# Patient Record
Sex: Male | Born: 1935 | Race: White | Hispanic: No | Marital: Married | Smoking: Former smoker
Health system: Southern US, Community
[De-identification: ages and names within clinical notes are randomized; demographics above are authoritative.]

## PROBLEM LIST (undated history)

## (undated) DIAGNOSIS — M79606 Pain in leg, unspecified: Secondary | ICD-10-CM

## (undated) DIAGNOSIS — G8929 Other chronic pain: Secondary | ICD-10-CM

## (undated) DIAGNOSIS — M549 Dorsalgia, unspecified: Secondary | ICD-10-CM

## (undated) HISTORY — DX: Dorsalgia, unspecified: M54.9

## (undated) HISTORY — DX: Pain in leg, unspecified: M79.606

## (undated) HISTORY — DX: Other chronic pain: G89.29

---

## 1960-12-12 HISTORY — PX: OTHER SURGICAL HISTORY: SHX169

## 2015-11-02 LAB — CBC AND DIFFERENTIAL
HCT: 40 — AB (ref 41–53)
HEMOGLOBIN: 13.3 — AB (ref 13.5–17.5)
Platelets: 168 (ref 150–399)
WBC: 7.7

## 2015-11-02 LAB — LIPID PANEL
CHOLESTEROL: 213 — AB (ref 0–200)
HDL: 49 (ref 35–70)
LDL Cholesterol: 136
Triglycerides: 141 (ref 40–160)

## 2015-11-02 LAB — HEPATIC FUNCTION PANEL
AST: 18 (ref 14–40)
Alkaline Phosphatase: 37 (ref 25–125)
BILIRUBIN, TOTAL: 0.6

## 2015-11-02 LAB — BASIC METABOLIC PANEL
BUN: 27 — AB (ref 4–21)
Creatinine: 1.5 — AB (ref 0.6–1.3)
GLUCOSE: 85
POTASSIUM: 4.8 (ref 3.4–5.3)
SODIUM: 140 (ref 137–147)

## 2017-11-09 LAB — HEPATIC FUNCTION PANEL
Alkaline Phosphatase: 47 (ref 25–125)
BILIRUBIN, TOTAL: 0.5

## 2017-11-09 LAB — CBC AND DIFFERENTIAL
HEMATOCRIT: 38 — AB (ref 41–53)
HEMOGLOBIN: 12.9 — AB (ref 13.5–17.5)
NEUTROS ABS: 5
WBC: 7.1

## 2017-11-09 LAB — BASIC METABOLIC PANEL
BUN: 26 — AB (ref 4–21)
Glucose: 100
POTASSIUM: 5.5 — AB (ref 3.4–5.3)
Sodium: 145 (ref 137–147)

## 2018-01-22 ENCOUNTER — Encounter: Payer: Self-pay | Admitting: Family Medicine

## 2018-10-01 ENCOUNTER — Ambulatory Visit: Payer: Medicare (Managed Care) | Admitting: Family Medicine

## 2018-10-01 ENCOUNTER — Encounter: Payer: Self-pay | Admitting: Family Medicine

## 2018-10-01 VITALS — BP 120/70 | HR 68 | Temp 98.0°F | Ht 68.0 in | Wt 161.4 lb

## 2018-10-01 DIAGNOSIS — M545 Low back pain: Secondary | ICD-10-CM | POA: Diagnosis not present

## 2018-10-01 DIAGNOSIS — I10 Essential (primary) hypertension: Secondary | ICD-10-CM | POA: Diagnosis not present

## 2018-10-01 DIAGNOSIS — M79604 Pain in right leg: Secondary | ICD-10-CM

## 2018-10-01 DIAGNOSIS — M549 Dorsalgia, unspecified: Secondary | ICD-10-CM

## 2018-10-01 DIAGNOSIS — Z23 Encounter for immunization: Secondary | ICD-10-CM

## 2018-10-01 DIAGNOSIS — M79606 Pain in leg, unspecified: Secondary | ICD-10-CM

## 2018-10-01 DIAGNOSIS — G8929 Other chronic pain: Secondary | ICD-10-CM | POA: Diagnosis not present

## 2018-10-01 MED ORDER — HYDROCODONE-ACETAMINOPHEN 5-325 MG PO TABS: 1.0000 | ORAL_TABLET | Freq: Four times a day (QID) | ORAL | 0 refills | Status: DC | PRN
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
## 2018-10-08 ENCOUNTER — Other Ambulatory Visit: Payer: Self-pay | Admitting: Family Medicine

## 2018-10-08 NOTE — Telephone Encounter (Signed)
Copied from CRM [id number]. Topic: Quick Communication - Rx Refill/Question >> Oct 08, 2018  8:57 AM Arlyss Gandy, NT wrote: Medication: mirtazapine (REMERON) 15 MG tablet, lisinopril (PRINIVIL,ZESTRIL) 20 MG tablet, atorvastatin (LIPITOR) 20 MG tablet, and donepezil (ARICEPT) 10 MG tablet   Has the patient contacted their pharmacy? Yes.   (Agent: If no, request that the patient contact the pharmacy for the refill.) (Agent: If yes, when and what did the pharmacy advise?)  Preferred Pharmacy (with phone number or street name): [facility] [address] [place] - [address] [phone] (Phone) [phone] (Fax)    Agent: Please be advised that RX refills may take up to 3 business days. We ask that you follow-up with your pharmacy.

## 2018-10-08 NOTE — Telephone Encounter (Signed)
Requested medication (s) are due for refill today: yes  Requested medication (s) are on the active medication list: yes    Last refill:   Future visit scheduled no  Notes to clinic:All meds by historical provider  Requested Prescriptions  Pending Prescriptions Disp Refills   atorvastatin (LIPITOR) 20 MG tablet      Sig: Take 1 tablet (20 mg total) by mouth daily.     Cardiovascular:  Antilipid - Statins Failed - 10/08/2018  9:07 AM      Failed - Total Cholesterol in normal range and within 360 days    No results found for: CHOL, POCCHOL       Failed - LDL in normal range and within 360 days    No results found for: LDLCALC, LDLC, HIRISKLDL       Failed - HDL in normal range and within 360 days    No results found for: HDL       Failed - Triglycerides in normal range and within 360 days    No results found for: TRIG       Passed - Patient is not pregnant      Passed - Valid encounter within last 12 months    Recent Outpatient Visits          1 week ago Chronic midline low back pain without sciatica   [facility], [place] C, MD            donepezil (ARICEPT) 10 MG tablet      Sig: Take 1 tablet (10 mg total) by mouth at bedtime.     Neurology:  Alzheimer's Agents Passed - 10/08/2018  9:07 AM      Passed - Valid encounter within last 6 months    Recent Outpatient Visits          1 week ago Chronic midline low back pain without sciatica   [facility], [place]ore, MD            lisinopril (PRINIVIL,ZESTRIL) 20 MG tablet      Sig: Take 1 tablet (20 mg total) by mouth daily.     Cardiovascular:  ACE Inhibitors Failed - 10/08/2018  9:07 AM      Failed - Cr in normal range and within 180 days    No results found for: CREATININE       Failed - K in normal range and within 180 days    No results found for: K, POTASSIUM       Passed - Patient is not pregnant      Passed - Last BP in normal range    BP  Readings from Last 1 Encounters:  10/01/18 120/70         Passed - Valid encounter within last 6 months    Recent Outpatient Visits          1 week ago Chronic midline low back pain without sciatica   [facility], [place] C, MD            mirtazapine (REMERON) 15 MG tablet      Sig: Take 1 tablet (15 mg total) by mouth at bedtime.     Psychiatry: Antidepressants - mirtazapine Failed - 10/08/2018  9:07 AM      Failed - AST in normal range and within 360 days    No results found for: POCAST, AST       Failed - ALT in normal range  and within 360 days    No results found for: ALT       Failed - Triglycerides in normal range and within 360 days    No results found for: TRIG       Failed - Total Cholesterol in normal range and within 360 days    No results found for: CHOL, POCCHOL       Failed - WBC in normal range and within 360 days    No results found for: WBC, WBCKUC       Passed - Valid encounter within last 6 months    Recent Outpatient Visits          1 week ago Chronic midline low back pain without sciatica   [facility], [place] Lore, [place]

## 2018-10-09 NOTE — Telephone Encounter (Signed)
All meds are Historical  Ok to fill?

## 2018-10-10 MED ORDER — LISINOPRIL 20 MG PO TABS: 20.0000 mg | ORAL_TABLET | Freq: Every day | ORAL | 1 refills | Status: DC

## 2018-10-10 MED ORDER — DONEPEZIL HCL 10 MG PO TABS: 10.0000 mg | ORAL_TABLET | Freq: Every day | ORAL | 1 refills | Status: DC

## 2018-10-10 MED ORDER — ATORVASTATIN CALCIUM 20 MG PO TABS: 20.0000 mg | ORAL_TABLET | Freq: Every day | ORAL | 1 refills | Status: DC

## 2018-10-10 MED ORDER — MIRTAZAPINE 15 MG PO TABS: 15.0000 mg | ORAL_TABLET | Freq: Every day | ORAL | 1 refills | Status: DC

## 2018-10-13 ENCOUNTER — Encounter: Payer: Self-pay | Admitting: Family Medicine

## 2018-10-13 DIAGNOSIS — H43813 Vitreous degeneration, bilateral: Secondary | ICD-10-CM | POA: Insufficient documentation

## 2018-10-13 DIAGNOSIS — H353190 Nonexudative age-related macular degeneration, unspecified eye, stage unspecified: Secondary | ICD-10-CM | POA: Insufficient documentation

## 2018-10-13 DIAGNOSIS — Z961 Presence of intraocular lens: Secondary | ICD-10-CM | POA: Insufficient documentation

## 2018-10-19 ENCOUNTER — Encounter: Payer: Self-pay | Admitting: Family Medicine

## 2018-10-20 ENCOUNTER — Telehealth: Payer: Self-pay | Admitting: Family Medicine

## 2018-10-20 DIAGNOSIS — I10 Essential (primary) hypertension: Secondary | ICD-10-CM

## 2018-10-20 DIAGNOSIS — N189 Chronic kidney disease, unspecified: Secondary | ICD-10-CM

## 2018-10-20 DIAGNOSIS — D649 Anemia, unspecified: Secondary | ICD-10-CM

## 2018-10-20 DIAGNOSIS — E785 Hyperlipidemia, unspecified: Secondary | ICD-10-CM

## 2018-10-20 NOTE — Telephone Encounter (Signed)
I was able to review his records. Last bloodwork I have is from 10/2017. So I have ordered repeat. If he thinks he has had sooner; then he will have to tell us where but that is the most updated they sent along with a more recent note.   He had kidney function that was worsening and anemia which should be followed up so please complete bloodwork at convenience. Thanks!

## 2018-10-22 ENCOUNTER — Encounter: Payer: Self-pay | Admitting: Family Medicine

## 2018-10-22 NOTE — Telephone Encounter (Signed)
Discussed notes with patients wife. Lab appointment made for Friday.

## 2018-10-26 ENCOUNTER — Other Ambulatory Visit (INDEPENDENT_AMBULATORY_CARE_PROVIDER_SITE_OTHER): Payer: Medicare (Managed Care)

## 2018-10-26 DIAGNOSIS — E785 Hyperlipidemia, unspecified: Secondary | ICD-10-CM

## 2018-10-26 DIAGNOSIS — I10 Essential (primary) hypertension: Secondary | ICD-10-CM

## 2018-10-26 DIAGNOSIS — N189 Chronic kidney disease, unspecified: Secondary | ICD-10-CM

## 2018-10-26 DIAGNOSIS — D649 Anemia, unspecified: Secondary | ICD-10-CM | POA: Diagnosis not present

## 2018-10-26 LAB — COMPREHENSIVE METABOLIC PANEL
ALBUMIN: 4.5 g/dL (ref 3.5–5.2)
ALK PHOS: 37 U/L — AB (ref 39–117)
ALT: 13 U/L (ref 0–53)
AST: 11 U/L (ref 0–37)
BILIRUBIN TOTAL: 0.5 mg/dL (ref 0.2–1.2)
BUN: 20 mg/dL (ref 6–23)
CALCIUM: 10 mg/dL (ref 8.4–10.5)
CO2: 32 mEq/L (ref 19–32)
Chloride: 107 mEq/L (ref 96–112)
Creatinine, Ser: 1.81 mg/dL — ABNORMAL HIGH (ref 0.40–1.50)
GFR: 38.28 mL/min — AB (ref >60.00)
GLUCOSE: 99 mg/dL (ref 70–99)
POTASSIUM: 5.1 meq/L (ref 3.5–5.1)
Sodium: 145 mEq/L (ref 135–145)
Total Protein: 6.8 g/dL (ref 6.0–8.3)

## 2018-10-26 LAB — CBC WITH DIFFERENTIAL/PLATELET
Basophils Absolute: 0 10*3/uL (ref 0.0–0.1)
Basophils Relative: 0.5 % (ref 0.0–3.0)
EOS PCT: 5 % (ref 0.0–5.0)
Eosinophils Absolute: 0.4 10*3/uL (ref 0.0–0.7)
HEMATOCRIT: 38.6 % — AB (ref 39.0–52.0)
Hemoglobin: 13 g/dL (ref 13.0–17.0)
LYMPHS ABS: 1.4 10*3/uL (ref 0.7–4.0)
LYMPHS PCT: 18.6 % (ref 12.0–46.0)
MCHC: 33.7 g/dL (ref 30.0–36.0)
MCV: 95.1 fl (ref 78.0–100.0)
MONOS PCT: 6.7 % (ref 3.0–12.0)
Monocytes Absolute: 0.5 10*3/uL (ref 0.1–1.0)
NEUTROS ABS: 5.4 10*3/uL (ref 1.4–7.7)
NEUTROS PCT: 69.2 % (ref 43.0–77.0)
Platelets: 182 10*3/uL (ref 150.0–400.0)
RBC: 4.06 Mil/uL — ABNORMAL LOW (ref 4.22–5.81)
RDW: 13.5 % (ref 11.5–15.5)
WBC: 7.7 10*3/uL (ref 4.0–10.5)

## 2018-10-26 LAB — LIPID PANEL
Cholesterol: 142 mg/dL (ref 0–200)
HDL: 56 mg/dL (ref >39.00)
LDL Cholesterol: 73 mg/dL (ref 0–99)
NonHDL: 86.28
TRIGLYCERIDES: 66 mg/dL (ref 0.0–149.0)
Total CHOL/HDL Ratio: 3
VLDL: 13.2 mg/dL (ref 0.0–40.0)

## 2018-10-26 LAB — VITAMIN B12: VITAMIN B 12: 494 pg/mL (ref 211–911)

## 2018-10-26 LAB — FOLATE

## 2018-10-26 LAB — TSH: TSH: 0.59 u[IU]/mL (ref 0.35–4.50)

## 2018-12-14 ENCOUNTER — Ambulatory Visit (INDEPENDENT_AMBULATORY_CARE_PROVIDER_SITE_OTHER): Payer: Medicare Other | Admitting: Family Medicine

## 2018-12-14 VITALS — BP 110/78 | HR 107 | Temp 99.1°F | Wt 159.0 lb

## 2018-12-14 DIAGNOSIS — B029 Zoster without complications: Secondary | ICD-10-CM

## 2018-12-14 MED ORDER — FAMCICLOVIR 500 MG PO TABS: 500.0000 mg | ORAL_TABLET | Freq: Every day | ORAL | 0 refills | Status: AC

## 2018-12-14 NOTE — Patient Instructions (Signed)
Shingles    Shingles, which is also known as herpes zoster, is an infection that causes a painful skin rash and fluid-filled blisters. It is caused by a virus.  Shingles only develops in people who:   Have had chickenpox.   Have been given a medicine to protect against chickenpox (have been vaccinated). Shingles is rare in this group.  What are the causes?  Shingles is caused by varicella-zoster virus (VZV). This is the same virus that causes chickenpox. After a person is exposed to VZV, the virus stays in the body in an inactive (dormant) state. Shingles develops if the virus is reactivated. This can happen many years after the first (initial) exposure to VZV. It is not known what causes this virus to be reactivated.  What increases the risk?  People who have had chickenpox or received the chickenpox vaccine are at risk for shingles. Shingles infection is more common in people who:   Are older than age 60.   Have a weakened disease-fighting system (immune system), such as people with:  ? HIV.  ? AIDS.  ? Cancer.   Are taking medicines that weaken the immune system, such as transplant medicines.   Are experiencing a lot of stress.  What are the signs or symptoms?  Early symptoms of this condition include itching, tingling, and pain in an area on your skin. Pain may be described as burning, stabbing, or throbbing.  A few days or weeks after early symptoms start, a painful red rash appears. The rash is usually on one side of the body and has a band-like or belt-like pattern. The rash eventually turns into fluid-filled blisters that break open, change into scabs, and dry up in about 2-3 weeks.  At any time during the infection, you may also develop:   A fever.   Chills.   A headache.   An upset stomach.  How is this diagnosed?  This condition is diagnosed with a skin exam. Skin or fluid samples may be taken from the blisters before a diagnosis is made. These samples are examined under a microscope or sent to  a lab for testing.  How is this treated?  The rash may last for several weeks. There is not a specific cure for this condition. Your health care provider will probably prescribe medicines to help you manage pain, recover more quickly, and avoid long-term problems. Medicines may include:   Antiviral drugs.   Anti-inflammatory drugs.   Pain medicines.   Anti-itching medicines (antihistamines).  If the area involved is on your face, you may be referred to a specialist, such as an eye doctor (ophthalmologist) or an ear, nose, and throat (ENT) doctor (otolaryngologist) to help you avoid eye problems, chronic pain, or disability.  Follow these instructions at home:  Medicines   Take over-the-counter and prescription medicines only as told by your health care provider.   Apply an anti-itch cream or numbing cream to the affected area as told by your health care provider.  Relieving itching and discomfort     Apply cold, wet cloths (cold compresses) to the area of the rash or blisters as told by your health care provider.   Cool baths can be soothing. Try adding baking soda or dry oatmeal to the water to reduce itching. Do not bathe in hot water.  Blister and rash care   Keep your rash covered with a loose bandage (dressing). Wear loose-fitting clothing to help ease the pain of material rubbing against the rash.     Keep your rash and blisters clean by washing the area with mild soap and cool water as told by your health care provider.   Check your rash every day for signs of infection. Check for:  ? More redness, swelling, or pain.  ? Fluid or blood.  ? Warmth.  ? Pus or a bad smell.   Do not scratch your rash or pick at your blisters. To help avoid scratching:  ? Keep your fingernails clean and cut short.  ? Wear gloves or mittens while you sleep, if scratching is a problem.  General instructions   Rest as told by your health care provider.   Keep all follow-up visits as told by your health care provider. This  is important.   Wash your hands often with soap and water. If soap and water are not available, use hand sanitizer. Doing this lowers your chance of getting a bacterial skin infection.   Before your blisters change into scabs, your shingles infection can cause chickenpox in people who have never had it or have never been vaccinated against it. To prevent this from happening, avoid contact with other people, especially:  ? Babies.  ? Pregnant women.  ? Children who have eczema.  ? Elderly people who have transplants.  ? People who have chronic illnesses, such as cancer or AIDS.  Contact a health care provider if:   Your pain is not relieved with prescribed medicines.   Your pain does not get better after the rash heals.   You have signs of infection in the rash area, such as:  ? More redness, swelling, or pain around the rash.  ? Fluid or blood coming from the rash.  ? The rash area feeling warm to the touch.  ? Pus or a bad smell coming from the rash.  Get help right away if:   The rash is on your face or nose.   You have facial pain, pain around your eye area, or loss of feeling on one side of your face.   You have difficulty seeing.   You have ear pain or have ringing in your ear.   You have a loss of taste.   Your condition gets worse.  Summary   Shingles, which is also known as herpes zoster, is an infection that causes a painful skin rash and fluid-filled blisters.   This condition is diagnosed with a skin exam. Skin or fluid samples may be taken from the blisters and examined before the diagnosis is made.   Keep your rash covered with a loose bandage (dressing). Wear loose-fitting clothing to help ease the pain of material rubbing against the rash.   Before your blisters change into scabs, your shingles infection can cause chickenpox in people who have never had it or have never been vaccinated against it.  This information is not intended to replace advice given to you by your health care  provider. Make sure you discuss any questions you have with your health care provider.  Document Released: 11/28/2005 Document Revised: 08/02/2017 Document Reviewed: 08/02/2017  Elsevier Interactive Patient Education  2019 Elsevier Inc.

## 2018-12-14 NOTE — Progress Notes (Signed)
Subjective:    Patient ID: Jonathon Martin, male    DOB: 01/28/1934, 85 y.o.   MRN: [id number]  No chief complaint on file. Pt accompanied by his wife and daughter.  HPI Patient was seen today for acute concern.  Pt with pmh sig for HTN, chronic pain, macular degeneration who is typically seen by Dr. Hassan Rowan.  Rash: -blisters around R sided -x 3 days -burning sensation and discomfort in area of rash -endorses h/o chicken pox as a child -pt had a shingles vaccine -denies recent illness, fever, increased stress. -has been keeping area covered with gauze  Past Medical History:  Diagnosis Date  . Chronic back pain   . Chronic leg pain     Allergies  Allergen Reactions  . Amlodipine Anaphylaxis  . Allegra [Fexofenadine]   . Avapro [Irbesartan]   . Ivp Dye [Iodinated Diagnostic Agents]   . Monopril [Fosinopril]     ROS General: Denies fever, chills, night sweats, changes in weight, changes in appetite HEENT: Denies headaches, ear pain, changes in vision, rhinorrhea, sore throat CV: Denies CP, palpitations, SOB, orthopnea Pulm: Denies SOB, cough, wheezing GI: Denies abdominal pain, nausea, vomiting, diarrhea, constipation GU: Denies dysuria, hematuria, frequency, vaginal discharge Msk: Denies muscle cramps, joint pains Neuro: Denies weakness, numbness, tingling Skin: Denies bruising   +rash Psych: Denies depression, anxiety, hallucinations    Objective:    Blood pressure 110/78, pulse (!) 107, temperature 99.1 F (37.3 C), temperature source Oral, weight 159 lb (72.1 kg), SpO2 97 %.  Gen. Pleasant, well-nourished, in no distress, normal affect  HEENT: Lowgap/AT, face symmetric, conjunctiva clear, no scleral icterus, PERRLA, nares patent without drainage Lungs: no accessory muscle use, CTAB, no wheezes or rales Cardiovascular: Tachycardia, no peripheral edema Neuro:  A&Ox3, CN II-XII intact, ambulates with cane Skin:  Warm.  Serous strike-through noted on removed dressings.   Severe vesicular rash from low back around R flank to lower abdomen and a few erythematous raised lesions in navel.  Ruptured vesicles on R lower back.  No vesicular lesions on face or other areas of body.     Wt Readings from Last 3 Encounters:  10/01/18 161 lb 6.4 oz (73.2 kg)    Lab Results  Component Value Date   WBC 7.7 10/26/2018   HGB 13.0 10/26/2018   HCT 38.6 (L) 10/26/2018   PLT 182.0 10/26/2018   GLUCOSE 99 10/26/2018   CHOL 142 10/26/2018   TRIG 66.0 10/26/2018   HDL 56.00 10/26/2018   LDLCALC 73 10/26/2018   ALT 13 10/26/2018   AST 11 10/26/2018   NA 145 10/26/2018   K 5.1 10/26/2018   CL 107 10/26/2018   CREATININE 1.81 (H) 10/26/2018   BUN 20 10/26/2018   CO2 32 10/26/2018   TSH 0.59 10/26/2018    Assessment/Plan:  Herpes zoster without complication  -renally dosed antiviral medication 2/2 creatinine clearance -rash dressed with Tefla nonstick bandages and cotton gauze  -given handout -discussed gabapentin for neuropathy, but pt and family decline at this time. - Plan: famciclovir (FAMVIR) 500 MG tablet daily x 7 days -given RTC or ED precautions.    F/u prn More than 50% of over 20 minutes spent in total face-to-face with the patient, counseling and/or coordinating care.   Abbe Amsterdam, MD

## 2018-12-16 ENCOUNTER — Encounter: Payer: Self-pay | Admitting: Family Medicine

## 2018-12-16 DIAGNOSIS — B029 Zoster without complications: Secondary | ICD-10-CM | POA: Insufficient documentation

## 2018-12-19 ENCOUNTER — Telehealth: Payer: Self-pay

## 2018-12-19 DIAGNOSIS — M79604 Pain in right leg: Secondary | ICD-10-CM

## 2018-12-19 DIAGNOSIS — M545 Low back pain: Secondary | ICD-10-CM

## 2018-12-19 DIAGNOSIS — G8929 Other chronic pain: Secondary | ICD-10-CM

## 2018-12-19 DIAGNOSIS — H409 Unspecified glaucoma: Secondary | ICD-10-CM
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
## 2018-12-19 NOTE — Telephone Encounter (Signed)
Ok both referrals

## 2018-12-19 NOTE — Telephone Encounter (Signed)
Referrals have been placed. LM informing patient

## 2018-12-20 ENCOUNTER — Telehealth: Payer: Self-pay

## 2018-12-20 NOTE — Telephone Encounter (Signed)
Copied from CRM [id number]. Topic: Referral - Request for Referral >> Dec 19, 2018  3:23 PM Trula Slade wrote: Has patient seen PCP for this complaint?   YES *If NO, is insurance requiring patient see PCP for this issue before PCP can refer them? Referral for which specialty:   Ophthalmology Preferred provider/office:  [facility] - [address] [place] [place] [address] [phone] Reason for referral:  Has Glaucoma

## 2018-12-20 NOTE — Telephone Encounter (Signed)
Referral was placed yesterday. Left a message making patient aware.

## 2018-12-25 NOTE — Telephone Encounter (Signed)
°   faxed insurance card to  [facility]   Copied from CRM [id number]. Topic: General - Other >> Dec 24, 2018 11:39 AM Arlyss Gandy, NT wrote: Reason for CRM: Bjorn Loser with [facility] is needing the pts insurance card faxed over to her for the pts appt on 01/09/2019. Fax#: [phone] AttnBjorn Loser CB#: [phone] ext [phone].

## 2019-01-25 ENCOUNTER — Telehealth: Payer: Self-pay | Admitting: Family Medicine

## 2019-01-25 ENCOUNTER — Other Ambulatory Visit: Payer: Self-pay | Admitting: Family Medicine

## 2019-01-25 DIAGNOSIS — M545 Low back pain: Principal | ICD-10-CM

## 2019-01-25 DIAGNOSIS — G8929 Other chronic pain: Secondary | ICD-10-CM

## 2019-01-25 MED ORDER — HYDROCODONE-ACETAMINOPHEN 5-325 MG PO TABS: 1.0000 | ORAL_TABLET | Freq: Four times a day (QID) | ORAL | 0 refills | Status: DC | PRN

## 2019-01-25 NOTE — Telephone Encounter (Signed)
Last fill 10/01/18 Last OV 10/01/18  Referral to pain clinic was denied. (you have a message on this.)

## 2019-01-25 NOTE — Telephone Encounter (Signed)
Please advise.  Referral placed for this in October states "nothing further we can do for this patient." Referral was denied.

## 2019-01-25 NOTE — Telephone Encounter (Signed)
Copied from CRM [id number]. Topic: Quick Communication - Rx Refill/Question >> Jan 25, 2019 12:48 PM Marylen Ponto wrote: Medication: HYDROcodone-acetaminophen (NORCO/VICODIN) 5-325 MG tablet  Has the patient contacted their pharmacy? no  Preferred Pharmacy (with phone number or street name): [facility] [address], [place] - [address] [phone] (Phone)  [phone] (Fax)  Agent: Please be advised that RX refills may take up to 3 business days. We ask that you follow-up with your pharmacy.

## 2019-01-25 NOTE — Telephone Encounter (Unsigned)
Copied from CRM [id number]. Topic: Referral - Request for Referral >> Jan 25, 2019 12:50 PM Marylen Ponto wrote: Has patient seen PCP for this complaint? yes  *If NO, is insurance requiring patient see PCP for this issue before PCP can refer them? Referral for which specialty: Pain management  Preferred provider/office: [facility]  - Dr. Lynett Fish Reason for referral: pain management

## 2019-01-25 NOTE — Telephone Encounter (Signed)
Let him know about that; I believe that I reached out to Gavin Pound to look into other options for him? If he is needing something soon; have him follow up with me. If he does want to see pain mgmt long term please check with Gavin Pound about other options?

## 2019-01-25 NOTE — Telephone Encounter (Signed)
Requested medication (s) are due for refill today:  Yes  Requested medication (s) are on the active medication list:   Yes  Controlled substance  Future visit scheduled:   No   Last ordered: 10/01/18  #100   Requested Prescriptions  Pending Prescriptions Disp Refills   HYDROcodone-acetaminophen (NORCO/VICODIN) 5-325 MG tablet 100 tablet 0    Sig: Take 1 tablet by mouth every 6 (six) hours as needed for moderate pain.     Not Delegated - Analgesics:  Opioid Agonist Combinations Failed - 01/25/2019 12:53 PM      Failed - This refill cannot be delegated      Failed - Urine Drug Screen completed in last 360 days.      Passed - Valid encounter within last 6 months    Recent Outpatient Visits          1 month ago Herpes zoster without complication   [facility] conservation officer at [facility], Bettey Mare, MD   3 months ago Chronic midline low back pain without sciatica   Nature conservation officer at [facility], Paris Lore, MD

## 2019-01-25 NOTE — Telephone Encounter (Signed)
He will need OV before any further refills.

## 2019-01-29 NOTE — Telephone Encounter (Signed)
Left message for patient to call back. CRM created 

## 2019-01-31 NOTE — Telephone Encounter (Signed)
Left message for patient to call back  

## 2019-02-01 NOTE — Telephone Encounter (Signed)
Patient is aware 

## 2019-02-11 ENCOUNTER — Encounter: Payer: Self-pay | Admitting: Family Medicine

## 2019-02-11 ENCOUNTER — Ambulatory Visit (INDEPENDENT_AMBULATORY_CARE_PROVIDER_SITE_OTHER): Payer: Medicare Other | Admitting: Family Medicine

## 2019-02-11 VITALS — BP 140/64 | HR 78 | Temp 97.9°F | Ht 68.0 in | Wt 148.2 lb

## 2019-02-11 DIAGNOSIS — G8929 Other chronic pain: Secondary | ICD-10-CM

## 2019-02-11 DIAGNOSIS — I10 Essential (primary) hypertension: Secondary | ICD-10-CM

## 2019-02-11 DIAGNOSIS — M545 Low back pain: Secondary | ICD-10-CM

## 2019-02-11 DIAGNOSIS — B029 Zoster without complications: Secondary | ICD-10-CM

## 2019-02-11 MED ORDER — GABAPENTIN 100 MG PO CAPS: ORAL_CAPSULE | ORAL | 2 refills | Status: DC

## 2019-02-11 MED ORDER — LIDOCAINE 5 % EX PTCH: 1.0000 | MEDICATED_PATCH | CUTANEOUS | 2 refills | Status: DC

## 2019-02-11 NOTE — Progress Notes (Signed)
Jonathon Martin DOB: July 27, 1934 Encounter date: 02/11/2019  This is a 85 y.o. male who presents with Chief Complaint  Patient presents with  . Medication Refill    History of present illness:  Patient was referred to pain management and they declined accepting him as new patient stating that there was nothing more they could offer.   Last visit in January patient had severe shingles outbreak. He is still having a significant amount of pain with this. He started with rash just a couple of days prior to last visit (12/14/18). Pain feels like electric shots and hasn't improved since rash started. Shocks from central back and bounces around to front. Pain is rated at 10/10. Happens quickly; shooting pain doesn't last but coming and going. Feels that back is swollen. Blisters have healed up. Getting "new skin" now.   Has mole on back he would like evaluated as well.   Hydrocodone does hep him with back pain (not with the pain from shingles however); helps him get around better. Leg doesn't bother him as much; much more back that bothers him and does not that it keeps away leg pain. For chronic back pain it is there daily - severity depends on activity level 8/10 and with hydrocodone feels he is down to about a 1/10. Lasts for about 3-4 hours. Some days will take less than 3. Usually gets more uncomfortable in afternoon/evening and feels that weather also contributes. Doesn't take more than 3 except on very rare occasion. Sometimes will take tylenol with pain medication. Does walk on regular basis. Is able to accomplish things around house that he needs to do. Can shower, get dressed by self which is good.   See above. He is doing ok overall. Would like more relief from shingles pain.   Attributes weight loss to severe shingles pain.     Allergies  Allergen Reactions  . Amlodipine Anaphylaxis  . Allegra [Fexofenadine]   . Avapro [Irbesartan]   . Ivp Dye [Iodinated Diagnostic Agents]   . Monopril  [Fosinopril]    Current Meds  Medication Sig  . atorvastatin (LIPITOR) 20 MG tablet Take 1 tablet (20 mg total) by mouth daily.  Marland Kitchen donepezil (ARICEPT) 10 MG tablet Take 1 tablet (10 mg total) by mouth at bedtime.  . gabapentin (NEURONTIN) 100 MG capsule Start with 1 capsule twice daily x 1 week and then increase to three times daily as needed/tolerated.  Marland Kitchen HYDROcodone-acetaminophen (NORCO/VICODIN) 5-325 MG tablet Take 1 tablet by mouth every 6 (six) hours as needed for moderate pain or severe pain.  Marland Kitchen lisinopril (PRINIVIL,ZESTRIL) 20 MG tablet Take 1 tablet (20 mg total) by mouth daily.  . mirtazapine (REMERON) 15 MG tablet Take 1 tablet (15 mg total) by mouth at bedtime.  . Multiple Vitamin (MULTIVITAMIN) capsule Take 1 capsule by mouth daily.  . [DISCONTINUED] gabapentin (NEURONTIN) 100 MG capsule Take 100 mg by mouth 3 (three) times daily.    Review of Systems  Constitutional: Negative for chills, fatigue and fever.  Respiratory: Negative for cough, chest tightness, shortness of breath and wheezing.   Cardiovascular: Negative for chest pain, palpitations and leg swelling.  Musculoskeletal: Positive for arthralgias, back pain and gait problem.  Skin: Positive for color change and rash.    Objective:  BP 140/64 (BP Location: Left Arm, Patient Position: Sitting, Cuff Size: Normal)   Pulse 78   Temp 97.9 F (36.6 C) (Oral)   Ht  (1.727 m)   Wt 148 lb 3.2 oz (  67.2 kg)   SpO2 97%   BMI 22.53 kg/m   Weight: 148 lb 3.2 oz (67.2 kg)   BP Readings from Last 3 Encounters:  02/11/19 140/64  12/14/18 110/78  10/01/18 120/70   Wt Readings from Last 3 Encounters:  02/11/19 148 lb 3.2 oz (67.2 kg)  12/14/18 159 lb (72.1 kg)  10/01/18 161 lb 6.4 oz (73.2 kg)    Physical Exam Constitutional:      General: He is not in acute distress.    Appearance: He is well-developed.  Cardiovascular:     Rate and Rhythm: Normal rate and regular rhythm.     Heart sounds: Normal heart  sounds. No murmur. No friction rub.  Pulmonary:     Effort: Pulmonary effort is normal. No respiratory distress.     Breath sounds: Normal breath sounds. No wheezing or rales.  Musculoskeletal:     Right lower leg: No edema.     Left lower leg: No edema.  Skin:    Comments: Shingles rash appears to be healing well over right anterior abdomen side and flank.  There is hyperpigmentation in area where previous rash was located.  Is where there was more ulcerated appearing rash (when looking at photograph) there has been new skin growth.  There is no notable warmth or erythema.  Patient is tender to the touch over this area and palpation produces some increased neuropathic discomfort  Neurological:     Mental Status: He is alert and oriented to person, place, and time.     Cranial Nerves: Cranial nerves are intact.     Motor: Motor function is intact.     Comments: There is a significant intention tremor noted bilaterally.  This is most prominent on finger-to-nose testing.  Psychiatric:        Behavior: Behavior normal.     Assessment/Plan  1. Herpes zoster without complication He is having a significant amount of postherpetic neuralgia.  We are going to restart gabapentin, which he previously took for ongoing back pain.  I have started this at a low dose and discussed side effects of medication with him.  I have advised him to call me back on Friday and update me on pain response to this.  In addition I am hoping that the lidocaine patch will give him some pain relief.  If this is too expensive for him or not approved through insurance we discussed considering topical compounded cream to help with discomfort. - lidocaine (LIDODERM) 5 %; Place 1 patch onto the skin daily. Remove & Discard patch within 12 hours or as directed by MD  Dispense: 30 patch; Refill: 2 - gabapentin (NEURONTIN) 100 MG capsule; Start with 1 capsule twice daily x 1 week and then increase to three times daily as  needed/tolerated.  Dispense: 90 capsule; Refill: 2  2. Chronic midline low back pain without sciatica Pain is stable at this point.  He does get very good relief with hydrocodone.  He uses sparingly.  Wife has been encouraging him to use this more often when pain is flaring for him [place] and uses with caution which I reassured him is a very good thing to do.  I suspect his need for the hydrocodone may be helped with the addition of gabapentin.  We will continue to monitor and plan for every 3 month follow-up.  3. Hypertension, unspecified type Stable.  Continue current medication.  We did discuss considering a low-dose beta-blocker for help with tremor.  I have asked  him to check his blood pressure over this next week and report back to me because I do worry about him dropping too low with blood pressure, especially once pain is better controlled.  Return in about 3 months (around 05/14/2019) for Chronic condition visit.       Theodis Shove, MD

## 2019-02-11 NOTE — Patient Instructions (Signed)
Please check blood pressures at home this week. Update me on Friday with numbers and with how pain is doing on gabapentin and with lidocaine patches.

## 2019-02-13 ENCOUNTER — Telehealth: Payer: Self-pay | Admitting: Family Medicine

## 2019-02-13 NOTE — Telephone Encounter (Signed)
PA has been denied.  

## 2019-02-13 NOTE — Telephone Encounter (Signed)
PA has been sent to cover my meds.  KeyMattie Marlin - PA Case ID: [id number] - Rx #: [id number]

## 2019-02-13 NOTE — Telephone Encounter (Signed)
Copied from CRM [id number]. Topic: Quick Communication - Rx Refill/Question >> Feb 13, 2019 10:40 AM Zada Girt, Lumin L wrote: Medication: lidocaine (LIDODERM) 5 % (insurance wont cover and patient needs it for shingles pain)  Has the patient contacted their pharmacy? Yes.   (Agent: If no, request that the patient contact the pharmacy for the refill.) (Agent: If yes, when and what did the pharmacy advise?)  Preferred Pharmacy (with phone number or street name): [facility] [address], [place] - [address]  Agent: Please be advised that RX refills may take up to 3 business days. We ask that you follow-up with your pharmacy.  Please call back to notify what can be done to get some releif for the patient. Please leave vmail if no answer.

## 2019-02-15 NOTE — Telephone Encounter (Signed)
Spoke with patient's pharmacy. They have used a good Rx coupon which brought the price down to $78.   Spoke with patient's wife. She is aware of the note above and stated she will pick up the prescription tomorrow.

## 2019-02-18 NOTE — Telephone Encounter (Signed)
[facility] is faxing over an appeal for this. Please Advise.

## 2019-04-03 ENCOUNTER — Other Ambulatory Visit: Payer: Self-pay | Admitting: Family Medicine

## 2019-04-03 DIAGNOSIS — M545 Low back pain: Principal | ICD-10-CM

## 2019-04-03 DIAGNOSIS — G8929 Other chronic pain: Secondary | ICD-10-CM

## 2019-04-03 MED ORDER — HYDROCODONE-ACETAMINOPHEN 5-325 MG PO TABS: 1.0000 | ORAL_TABLET | Freq: Four times a day (QID) | ORAL | 0 refills | Status: DC | PRN

## 2019-04-03 NOTE — Telephone Encounter (Signed)
Last filled 01/25/2019 Last OV 02/11/2019  Ok to fill?

## 2019-04-03 NOTE — Telephone Encounter (Signed)
Refilled hydrocodone 

## 2019-04-03 NOTE — Telephone Encounter (Signed)
Requested medication (s) are due for refill today: yes  Requested medication (s) are on the active medication list: yes  Last refill: 01/25/2019  #100 0 refills  Future visit scheduled Yes  Dr. Hassan Rowan  Notes to clinic:not delegated  Requested Prescriptions  Pending Prescriptions Disp Refills   HYDROcodone-acetaminophen (NORCO/VICODIN) 5-325 MG tablet 100 tablet 0    Sig: Take 1 tablet by mouth every 6 (six) hours as needed for moderate pain or severe pain.     Not Delegated - Analgesics:  Opioid Agonist Combinations Failed - 04/03/2019  9:07 AM      Failed - This refill cannot be delegated      Failed - Urine Drug Screen completed in last 360 days.      Passed - Valid encounter within last 6 months    Recent Outpatient Visits          1 month ago Herpes zoster without complication   Nature conservation officer at [facility], Paris Lore, MD   3 months ago Herpes zoster without complication   Nature conservation officer at [facility], Bettey Mare, MD   6 months ago Chronic midline low back pain without sciatica   Nature conservation officer at [facility] [employer], Paris Lore, MD      Future Appointments            In 1 month Koberlein, Paris Lore, MD [facility] [place], [place]         Signed Prescriptions Disp Refills   lisinopril (ZESTRIL) 20 MG tablet 90 tablet 0    Sig: Take 1 tablet by mouth once daily     Cardiovascular:  ACE Inhibitors Failed - 04/03/2019  9:07 AM      Failed - Cr in normal range and within 180 days    Creatinine, Ser  Date Value Ref Range Status  10/26/2018 1.81 (H) 0.40 - 1.50 mg/dL Final         Failed - Last BP in normal range    BP Readings from Last 1 Encounters:  02/11/19 140/64         Passed - K in normal range and within 180 days    Potassium  Date Value Ref Range Status  10/26/2018 5.1 3.5 - 5.1 mEq/L Final         Passed - Patient is not pregnant      Passed - Valid encounter within last 6 months    Recent Outpatient Visits           1 month ago Herpes zoster without complication   Nature conservation officer at [facility] [employer], [place] C, MD   3 months ago Herpes zoster without complication   Nature conservation officer at [facility] [employer], Bettey Mare, MD   6 months ago Chronic midline low back pain without sciatica   Nature conservation officer at [facility] [employer], Paris Lore, MD      Future Appointments            In 1 month Koberlein, Paris Lore, MD [facility] [place], [facility]

## 2019-04-10 ENCOUNTER — Other Ambulatory Visit: Payer: Self-pay | Admitting: Family Medicine

## 2019-04-18 ENCOUNTER — Other Ambulatory Visit: Payer: Self-pay | Admitting: *Deleted

## 2019-04-18 MED ORDER — MIRTAZAPINE 15 MG PO TABS: 15.0000 mg | ORAL_TABLET | Freq: Every day | ORAL | 0 refills | Status: DC

## 2019-04-18 NOTE — Telephone Encounter (Signed)
Rx done. 

## 2019-04-23 ENCOUNTER — Other Ambulatory Visit: Payer: Self-pay | Admitting: *Deleted

## 2019-04-23 MED ORDER — DONEPEZIL HCL 10 MG PO TABS: 10.0000 mg | ORAL_TABLET | Freq: Every day | ORAL | 1 refills | Status: DC

## 2019-04-23 NOTE — Telephone Encounter (Signed)
Rx done. 

## 2019-05-08 ENCOUNTER — Telehealth: Payer: Self-pay | Admitting: Family Medicine

## 2019-05-08 MED ORDER — DONEPEZIL HCL 10 MG PO TABS: 10.0000 mg | ORAL_TABLET | Freq: Every day | ORAL | 1 refills | Status: DC

## 2019-05-08 NOTE — Telephone Encounter (Signed)
Rx done. 

## 2019-05-08 NOTE — Telephone Encounter (Signed)
Copied from CRM [id number]. Topic: Quick Communication - Rx Refill/Question >> May 08, 2019 12:50 PM Richarda Blade wrote: Medication: donepezil (ARICEPT) 10 MG tablet   Has the patient contacted their pharmacy? No. (Agent: If no, request that the patient contact the pharmacy for the refill.) the patient's wife called to get a refill on his medication   Preferred Pharmacy (with phone number or street name): [facility] [address] [place] - [address] [phone] (Phone) [phone] (Fax)    Agent: Please be advised that RX refills may take up to 3 business days. We ask that you follow-up with your pharmacy.

## 2019-05-13 ENCOUNTER — Encounter: Payer: Self-pay | Admitting: Family Medicine

## 2019-05-13 ENCOUNTER — Ambulatory Visit (INDEPENDENT_AMBULATORY_CARE_PROVIDER_SITE_OTHER): Payer: Medicare Other | Admitting: Family Medicine

## 2019-05-13 ENCOUNTER — Other Ambulatory Visit: Payer: Self-pay

## 2019-05-13 VITALS — Wt 143.0 lb

## 2019-05-13 DIAGNOSIS — G8929 Other chronic pain: Secondary | ICD-10-CM

## 2019-05-13 DIAGNOSIS — I10 Essential (primary) hypertension: Secondary | ICD-10-CM

## 2019-05-13 DIAGNOSIS — M545 Low back pain, unspecified: Secondary | ICD-10-CM

## 2019-05-13 DIAGNOSIS — B0223 Postherpetic polyneuropathy: Secondary | ICD-10-CM

## 2019-05-13 MED ORDER — HYDROCODONE-ACETAMINOPHEN 5-325 MG PO TABS: 1.0000 | ORAL_TABLET | Freq: Four times a day (QID) | ORAL | 0 refills | Status: DC | PRN

## 2019-05-13 MED ORDER — LIDOCAINE 0.5 % EX GEL: CUTANEOUS | 5 refills | Status: DC

## 2019-05-13 NOTE — Progress Notes (Signed)
Virtual Visit via Telephone Note  I connected with Jonathon Martin  on 05/13/19 at  9:30 AM EDT by telephone and verified that I am speaking with the correct person using two identifiers.   I discussed the limitations, risks, security and privacy concerns of performing an evaluation and management service by telephone and the availability of in person appointments. I also discussed with the patient that there may be a patient responsible charge related to this service. The patient expressed understanding and agreed to proceed.  Location patient: home Location provider: work office Participants present for the call: patient, provider Patient did not have a visit in the prior 7 days to address this/these issue(s).   History of Present Illness:  Not doing anything right now for shingles pain. Still having pain from back around to belly button. Does have gabapentin taking 1 in morning and night. Not noting much improvement with this. Lidocaine patch was helpful, but it stopped working. No bad side effects from gabapentin.   Pain wise with shingles pain is 8/10. Does take pain medication and tylenol which does seem to help. Baking soda helps to settle stomach when it gets flared with pain.  Feels that pain decreases to somewhere between a 2-4 with pain medication in combination with Tylenol.  Back pain also is well controlled with pain medication.  Tylenol alone does not help.  With pain medication, he is able to bring pain down to about a 4 out of 10.  The back bothers him on a daily basis.  Usually is taking pain medication about twice daily.  Has some congestion; stable at baseline.   Does have a little right hand shaking at home. Seems about the same, but bothers him with writing, drinking coffee, etc. Uses left hand. Not checking blood pressures at home (forgot from last visit)   Observations/Objective: Patient sounds cheerful and well on the phone. I do not appreciate any SOB. Speech and  thought processing are grossly intact. Patient reported vitals:  Assessment and Plan:  1. Chronic midline low back pain without sciatica Chronic, not surgical candidate.  He does get relief from pain medication uses sparingly.  Initially was unable to refill today due to technical difficulties, but this was sent in electronically once these difficulties were resolved. - HYDROcodone-acetaminophen (NORCO/VICODIN) 5-325 MG tablet; Take 1 tablet by mouth every 6 (six) hours as needed for moderate pain or severe pain.  Dispense: 100 tablet; Refill: 0  2. Postherpetic polyneuropathy He has not had significant relief with lidocaine patches alone.  I would prefer him not to take pain medication in order to help with post herpetic discomfort.  We did find a local pharmacy that will compound a topical cream for him which includes lidocaine ketoprofen, and amitriptyline.  Hopefully this will provide him with some additional relief.  No if it does not, because, as we discussed, we could increase his Neurontin to help with neuropathic pain.  Him to consider increasing to at least 2 capsules (200 mg) twice daily of Neurontin to see if this gives him some additional relief. - Lidocaine 0.5 % GEL; Apply sparingly TID prn pain in shingles area.  Dispense: 60 g; Refill: 5 - HYDROcodone-acetaminophen (NORCO/VICODIN) 5-325 MG tablet; Take 1 tablet by mouth every 6 (six) hours as needed for moderate pain or severe pain.  Dispense: 100 tablet; Refill: 0  3. Hypertension, unspecified type They will let me know how blood pressures look.  We discussed considering addition of a beta-blocker to  help with right hand tremor, but wanted to make sure that blood pressures were stable to handle this medication.  Follow Up Instructions: We will check in with them in 1 weeks time.    I did not refer this patient for an OV in the next 24 hours for this/these issue(s).  I discussed the assessment and treatment plan with the  patient. The patient was provided an opportunity to ask questions and all were answered. The patient agreed with the plan and demonstrated an understanding of the instructions.   The patient was advised to call back or seek an in-person evaluation if the symptoms worsen or if the condition fails to improve as anticipated.  I provided 15 minutes of non-face-to-face time during this encounter.   Theodis Shove, MD

## 2019-05-17 ENCOUNTER — Telehealth: Payer: Self-pay | Admitting: Family Medicine

## 2019-05-17 ENCOUNTER — Other Ambulatory Visit: Payer: Self-pay | Admitting: Family Medicine

## 2019-05-17 DIAGNOSIS — B029 Zoster without complications: Secondary | ICD-10-CM

## 2019-05-17 MED ORDER — GABAPENTIN 100 MG PO CAPS: 200.0000 mg | ORAL_CAPSULE | Freq: Two times a day (BID) | ORAL | 1 refills | Status: DC

## 2019-05-17 NOTE — Telephone Encounter (Signed)
completed

## 2019-05-17 NOTE — Telephone Encounter (Signed)
Copied from CRM [id number]. Topic: Quick Communication - Rx Refill/Question >> May 17, 2019 10:19 AM Jaquita Rector A wrote: Medication: gabapentin (NEURONTIN) 100 MG capsule, HYDROcodone-acetaminophen (NORCO/VICODIN) 5-325 MG tablet Req 90 day supply   Has the patient contacted their pharmacy? Yes.   (Agent: If no, request that the patient contact the pharmacy for the refill.) (Agent: If yes, when and what did the pharmacy advise?)  Preferred Pharmacy (with phone number or street name): [facility] [address] [place] - [address] [phone] (Phone) [phone] (Fax)    Agent: Please be advised that RX refills may take up to 3 business days. We ask that you follow-up with your pharmacy.

## 2019-05-17 NOTE — Telephone Encounter (Signed)
Pt's wife called and states that pt is taking 100mg  Gabapentin. He takes 2 in the morning and 2 in the evening. Pt's wife would like a 90 day supply called in.

## 2019-05-17 NOTE — Telephone Encounter (Deleted)
Copied from CRM [id number]. Topic: General - Other >> May 17, 2019 10:13 AM Jaquita Rector A wrote: Reason for CRM: Patient wife called to say that patient BP ( 6/1 pm BP 122/71 HR. 65 ),   (  6/2 167/67 HR 64  Pm 122/70 HR 63) (  6/3 am BP 128/79 HR 75  PM 124/74 HR 70 ) ( 6/4 AM  108/65 HR 66 PM 122/69 HR 73)  6/5 132/78 HR 65

## 2019-05-17 NOTE — Telephone Encounter (Signed)
Can you check with him about current gabapentin dosing? I advised it was ok to increase the dose; so want to see what he is taking so I know what to send in. I had already sent in his pain medication to pharmacy at last visit (6/1). Did he not get this?

## 2019-05-20 ENCOUNTER — Telehealth: Payer: Self-pay | Admitting: *Deleted

## 2019-05-20 NOTE — Telephone Encounter (Signed)
Is he still taking remeron? This would be something started for mood that might also help appetite. OK to refill if taking because looks like he is due.   I wanted to know blood pressures because we discussed adding on propranolol to help with tremor. But, current bp are low for me to add this on. If tremor is bothering him enough he wants to continue to pursue treatment, I would suggest he cut lisinopril in half and then let me know pressures in 2 weeks time along with HR.

## 2019-05-20 NOTE — Telephone Encounter (Signed)
I called Mrs Harlyn Italiano and she stated she already gave these numbers to someone named Santiago Glad last week? I do not see this in the note from [facility] only includes a request for medications.  Stated numbers for BP were in the 120's/70's and all were normal.  Mrs Hanif Radin stated she threw these away and message sent to Dr Ethlyn Gallery.

## 2019-05-20 NOTE — Telephone Encounter (Signed)
-----   Message from Caren Macadam, MD sent at 05/13/2019 11:11 AM EDT ----- Please call wife: [phone] in 1 weeks time to get blood pressure readings. AND; please let them know that I [facility] in [facility]. Hopefully this will help (please give them pharmacy info so they can pick up; not sure if they told you cost?). Also, I cannot send controlled substances today. So I have printed his pain medication which they can pick up; OR; i'm working on getting this fixed and I think they have a supply at home for a few days they can wait and hopefully I'll be up and running in a couple of days if that's ok. Let me know preference.

## 2019-05-21 NOTE — Telephone Encounter (Signed)
I called the pt and his wife was informed of the message below.  She stated the pt is taking Remeron and does not need refills at this time.  She agreed to call back with the recordings of BP readings in 2 weeks.

## 2019-07-05 ENCOUNTER — Telehealth: Payer: Self-pay | Admitting: Family Medicine

## 2019-07-05 NOTE — Telephone Encounter (Signed)
[place] for refills. Please check on blood pressures as wife was going to shart.

## 2019-07-05 NOTE — Telephone Encounter (Signed)
Medication Refill - Medication: atorvastatin (LIPITOR) 20 MG tablet [[id number]]   mirtazapine (REMERON) 15 MG tablet [[id number]]    Has the patient contacted their pharmacy? No. (Agent: If no, request that the patient contact the pharmacy for the refill.) 90 tablets of each medication  Preferred Pharmacy (with phone number or street name):  [place], [place] - [address] [phone] (Phone) [phone] (Fax)   The agent was told that someone will call the wife back to get [place] to weeks of BP readings.   Agent: Please be advised that RX refills may take up to 3 business days. We ask that you follow-up with your pharmacy.

## 2019-07-08 NOTE — Telephone Encounter (Signed)
I left a detailed message at the pts home number with the message below and asked that a return call be made with blood pressure readings.

## 2019-07-08 NOTE — Telephone Encounter (Signed)
Pt's wife called back with blood pressure readings.  05/22/2019 pm:  129/78, Pulse 56 05/23/2019 am: 130/75, pulse 74 05/23/2019 pm:  131/75, pulse 75 05/24/2019 am: 110/67, pulse 78 05/24/2019 pm: 110/89, pulse 52 05/25/2019 am: 130/73, pulse 84 05/25/2019 pm:  142/88, pulse 76 05/26/2019 am:  139/75, pulse 67 05/26/2019 pm:  124/67, pulse 64 05/27/2019 am:  126/74, pulse 65 *taken hydrocodone 1 hour to reading 05/27/2019 pm: 118/77, pulse 72 *in a reclining positon 05/28/2019 am - no reading 05/28/2019 pm: 134/68, pulse 77 05/29/2019 am - no reading 05/29/2019 pm:  144/83, pulse 68 05/30/2019 am: 142/79, pulse 68 05/30/2019 pm: 136/83, pulse 82 05/31/2019 am:  136/83, pulse 66 05/31/2019 pm:  153/88, pulse 67 06/01/2019 am - no reading 06/01/2019 pm:  124/76, pulse 72 06/02/2019 am - no reading 06/02/2019 pm:  108/63, pulse 72 06/03/2019 am:  102/58, pulse 70 06/03/2019 pm:  106/62, pulse 72 06/04/2019 am:  138/85, pulse 62 06/04/2019 pm:  141/72, pulse 63 06/05/2019 am:  147/96, pulse 64 06/05/2019 pm:  117/68 pulse 81

## 2019-07-08 NOTE — Telephone Encounter (Signed)
Thank you so much; those were a lot of readings. Looks like overall he is in good range with blood pressure.   Some of HR are a little lower, so I am hesitant to add on beta blocker we had discussed for tremor (and I have also looked at old EKG we had scanned in and feel this might not be best option).   At this point, if he feels that tremor is bothering him significantly; I think that meeting with neurologist for evaluation might be next best step. [place] for referral if desired. If he wants to wait/monitor and we can re-discuss at follow up (or he wants to consider for later) that is fine too.

## 2019-07-09 NOTE — Telephone Encounter (Signed)
I left a message for the pt to return my call.  CRM also created. 

## 2019-07-19 ENCOUNTER — Other Ambulatory Visit: Payer: Self-pay | Admitting: Family Medicine

## 2019-07-19 ENCOUNTER — Telehealth: Payer: Self-pay | Admitting: Family Medicine

## 2019-07-19 DIAGNOSIS — G8929 Other chronic pain: Secondary | ICD-10-CM

## 2019-07-19 DIAGNOSIS — B0223 Postherpetic polyneuropathy: Secondary | ICD-10-CM

## 2019-07-19 MED ORDER — HYDROCODONE-ACETAMINOPHEN 5-325 MG PO TABS: 1.0000 | ORAL_TABLET | Freq: Four times a day (QID) | ORAL | 0 refills | Status: DC | PRN

## 2019-07-19 MED ORDER — MIRTAZAPINE 15 MG PO TABS: 15.0000 mg | ORAL_TABLET | Freq: Every day | ORAL | 1 refills | Status: DC

## 2019-07-19 NOTE — Telephone Encounter (Signed)
Copied from [place] [id number] [id number]. Topic: General - Other >> Jul 19, 2019 11:01 AM Jodie Echevaria wrote: Reason for CRM: Rx sent to pharmacy for mirtazapine (REMERON) 15 MG tablet on 06/20/2019 failed and need to be re sent plus wife would like an Rx sent in for medication for his back pain please if there are any questions please call Mrs Eldo Umanzor at Ph# [phone]

## 2019-07-19 NOTE — Telephone Encounter (Signed)
Both remeron and hydrocodone sent.

## 2019-07-19 NOTE — Telephone Encounter (Signed)
Noted  

## 2019-07-29 NOTE — Telephone Encounter (Signed)
Called pt and LMOVM to return call in regards to tremor.

## 2019-08-01 ENCOUNTER — Other Ambulatory Visit: Payer: Self-pay | Admitting: Family Medicine

## 2019-08-09 ENCOUNTER — Telehealth: Payer: Self-pay | Admitting: Family Medicine

## 2019-08-09 MED ORDER — ATORVASTATIN CALCIUM 20 MG PO TABS: 20.0000 mg | ORAL_TABLET | Freq: Every day | ORAL | 1 refills | Status: DC

## 2019-08-09 MED ORDER — LISINOPRIL 20 MG PO TABS: 20.0000 mg | ORAL_TABLET | Freq: Every day | ORAL | 0 refills | Status: DC

## 2019-08-09 NOTE — Telephone Encounter (Signed)
Rx done. 

## 2019-08-09 NOTE — Telephone Encounter (Signed)
Pt needs lisinopril and atorvastatin please. [facility] [address]

## 2019-10-29 ENCOUNTER — Telehealth: Payer: Self-pay | Admitting: Family Medicine

## 2019-10-29 NOTE — Telephone Encounter (Signed)
Medication Refill - Medication: HYDROcodone-acetaminophen (NORCO/VICODIN) 5-325 MG tablet   Has the patient contacted their pharmacy? No. (Agent: If no, request that the patient contact the pharmacy for the refill.) (Agent: If yes, when and what did the pharmacy advise?)  Preferred Pharmacy (with phone number or street name): [place], [place] - [address] [phone] (Phone) [phone] (Fax)     Agent: Please be advised that RX refills may take up to 3 business days. We ask that you follow-up with your pharmacy.

## 2019-10-30 ENCOUNTER — Other Ambulatory Visit: Payer: Self-pay | Admitting: Family Medicine

## 2019-10-30 DIAGNOSIS — G8929 Other chronic pain: Secondary | ICD-10-CM

## 2019-10-30 DIAGNOSIS — M545 Low back pain, unspecified: Secondary | ICD-10-CM

## 2019-10-30 DIAGNOSIS — B0223 Postherpetic polyneuropathy: Secondary | ICD-10-CM

## 2019-10-30 MED ORDER — HYDROCODONE-ACETAMINOPHEN 5-325 MG PO TABS: 1.0000 | ORAL_TABLET | Freq: Four times a day (QID) | ORAL | 0 refills | Status: DC | PRN

## 2019-10-30 NOTE — Telephone Encounter (Signed)
Noted  

## 2019-10-30 NOTE — Telephone Encounter (Signed)
sent 

## 2019-10-31 ENCOUNTER — Other Ambulatory Visit: Payer: Self-pay | Admitting: Family Medicine

## 2019-10-31 MED ORDER — DONEPEZIL HCL 10 MG PO TABS: 10.0000 mg | ORAL_TABLET | Freq: Every day | ORAL | 0 refills | Status: DC

## 2019-10-31 MED ORDER — ATORVASTATIN CALCIUM 20 MG PO TABS: 20.0000 mg | ORAL_TABLET | Freq: Every day | ORAL | 1 refills | Status: DC

## 2019-10-31 NOTE — Telephone Encounter (Signed)
Medication Refill - Medication:      atorvastatin (LIPITOR) 20 MG tablet    donepezil (ARICEPT) 10 MG tablet    gabapentin (NEURONTIN) 100 MG capsule        Preferred Pharmacy (with phone number or street name):  [place], [place] - [address] [phone] (Phone) [phone] (Fax)     Agent: Please be advised that RX refills may take up to 3 business days. We ask that you follow-up with your pharmacy.

## 2019-11-15 ENCOUNTER — Other Ambulatory Visit: Payer: Self-pay | Admitting: Family Medicine

## 2019-11-15 DIAGNOSIS — B029 Zoster without complications: Secondary | ICD-10-CM

## 2019-11-15 MED ORDER — GABAPENTIN 100 MG PO CAPS: 200.0000 mg | ORAL_CAPSULE | Freq: Two times a day (BID) | ORAL | 0 refills | Status: DC

## 2019-11-15 NOTE — Telephone Encounter (Signed)
Requested medication (s) are due for refill today: yes  Requested medication (s) are on the active medication list: yes  Last refill: 08/09/2019  Future visit scheduled: no  Notes to clinic:  Review for refill Overdue for office visit    Requested Prescriptions  Pending Prescriptions Disp Refills   lisinopril (ZESTRIL) 20 MG tablet 90 tablet 0    Sig: Take 1 tablet (20 mg total) by mouth daily.     Cardiovascular:  ACE Inhibitors Failed - 11/15/2019  1:27 PM      Failed - Cr in normal range and within 180 days    Creatinine, Ser  Date Value Ref Range Status  10/26/2018 1.81 (H) 0.40 - 1.50 mg/dL Final         Failed - K in normal range and within 180 days    Potassium  Date Value Ref Range Status  10/26/2018 5.1 3.5 - 5.1 mEq/L Final         Failed - Last BP in normal range    BP Readings from Last 1 Encounters:  02/11/19 140/64         Failed - Valid encounter within last 6 months    Recent Outpatient Visits          6 months ago Postherpetic polyneuropathy   [facility], [place], [place]   9 months ago Herpes zoster without complication   [facility], [place], [place]   11 months ago Herpes zoster without complication   [facility]rap[facility] at [facility], [place], [place]   1 year ago Chronic midline low back pain without sciatica   [place], [place], [place]             Passed - Patient is not pregnant

## 2019-11-15 NOTE — Telephone Encounter (Signed)
Copied from [place] [id number]. Topic: Quick Communication - Rx Refill/Question >> Nov 15, 2019  1:11 PM Izola Price, [place] A wrote: Medication: gabapentin (NEURONTIN) 100 MG capsule (Pt requesting 3 month supply)  Has the patient contacted their pharmacy? Yes (Agent: If no, request that the patient contact the pharmacy for the refill.) (Agent: If yes, when and what did the pharmacy advise?)Contact Pcp  Preferred Pharmacy (with phone number or street name): [place], [place] - [address] [phone] (Phone) [phone] (Fax)    Agent: Please be advised that RX refills may take up to 3 business days. We ask that you follow-up with your pharmacy.

## 2019-11-15 NOTE — Telephone Encounter (Signed)
Copied from [place] [id number]. Topic: Quick Communication - Rx Refill/Question >> Nov 15, 2019  1:25 PM Izola Price, [place] A wrote: Medication: lisinopril (ZESTRIL) 20 MG tablet (Pt requesting 90 day supply)  Has the patient contacted their pharmacy? Yes (Agent: If no, request that the patient contact the pharmacy for the refill.) (Agent: If yes, when and what did the pharmacy advise?)Contact PCP  Preferred Pharmacy (with phone number or street name):[place], [place] - [address] [phone] (Phone) [phone] (Fax)    Agent: Please be advised that RX refills may take up to 3 business days. We ask that you follow-up with your pharmacy.

## 2019-11-18 MED ORDER — LISINOPRIL 20 MG PO TABS: 20.0000 mg | ORAL_TABLET | Freq: Every day | ORAL | 0 refills | Status: DC

## 2020-01-13 ENCOUNTER — Telehealth: Payer: Self-pay | Admitting: Family Medicine

## 2020-01-13 NOTE — Telephone Encounter (Signed)
Pt is calling in need of a Rx refill Hydrocodone acetaminophen 5-325 MG (would like to get 90 days).   Pharm:  [facility] [address]

## 2020-01-15 ENCOUNTER — Other Ambulatory Visit: Payer: Self-pay | Admitting: Family Medicine

## 2020-01-15 DIAGNOSIS — G8929 Other chronic pain: Secondary | ICD-10-CM

## 2020-01-15 DIAGNOSIS — B0223 Postherpetic polyneuropathy: Secondary | ICD-10-CM

## 2020-01-15 MED ORDER — HYDROCODONE-ACETAMINOPHEN 5-325 MG PO TABS: 1.0000 | ORAL_TABLET | Freq: Four times a day (QID) | ORAL | 0 refills | Status: DC | PRN

## 2020-01-15 NOTE — Telephone Encounter (Signed)
I did send a refill, but he does need to schedule follow-up visit.  I cannot refill again until I see him.  If he is getting a controlled substance we have to see him every 3 months.  It would be okay for [place] to do a virtual visit if hewould like to avoid coming into the office.

## 2020-01-15 NOTE — Telephone Encounter (Signed)
Spoke with the pt and informed him of the message below.  Appt scheduled for 2/17 at 1pm.

## 2020-01-19 ENCOUNTER — Other Ambulatory Visit: Payer: Self-pay | Admitting: Family Medicine

## 2020-01-29 ENCOUNTER — Other Ambulatory Visit: Payer: Self-pay

## 2020-01-29 ENCOUNTER — Telehealth (INDEPENDENT_AMBULATORY_CARE_PROVIDER_SITE_OTHER): Payer: Medicare Other | Admitting: Family Medicine

## 2020-01-29 DIAGNOSIS — N189 Chronic kidney disease, unspecified: Secondary | ICD-10-CM

## 2020-01-29 DIAGNOSIS — G8929 Other chronic pain: Secondary | ICD-10-CM

## 2020-01-29 DIAGNOSIS — M545 Low back pain, unspecified: Secondary | ICD-10-CM

## 2020-01-29 DIAGNOSIS — B029 Zoster without complications: Secondary | ICD-10-CM

## 2020-01-29 DIAGNOSIS — B0223 Postherpetic polyneuropathy: Secondary | ICD-10-CM | POA: Diagnosis not present

## 2020-01-29 DIAGNOSIS — R413 Other amnesia: Secondary | ICD-10-CM | POA: Diagnosis not present

## 2020-01-29 DIAGNOSIS — E785 Hyperlipidemia, unspecified: Secondary | ICD-10-CM

## 2020-01-29 DIAGNOSIS — E538 Deficiency of other specified B group vitamins: Secondary | ICD-10-CM

## 2020-01-29 DIAGNOSIS — R251 Tremor, unspecified: Secondary | ICD-10-CM | POA: Diagnosis not present

## 2020-01-29 DIAGNOSIS — I10 Essential (primary) hypertension: Secondary | ICD-10-CM

## 2020-01-29 DIAGNOSIS — D649 Anemia, unspecified: Secondary | ICD-10-CM

## 2020-01-29 MED ORDER — DONEPEZIL HCL 10 MG PO TABS: 10.0000 mg | ORAL_TABLET | Freq: Every day | ORAL | 0 refills | Status: DC

## 2020-01-29 MED ORDER — GABAPENTIN 100 MG PO CAPS: 300.0000 mg | ORAL_CAPSULE | Freq: Two times a day (BID) | ORAL | 0 refills | Status: DC

## 2020-01-29 MED ORDER — ATORVASTATIN CALCIUM 20 MG PO TABS: 20.0000 mg | ORAL_TABLET | Freq: Every day | ORAL | 1 refills | Status: DC

## 2020-01-29 MED ORDER — LISINOPRIL 20 MG PO TABS: 20.0000 mg | ORAL_TABLET | Freq: Every day | ORAL | 1 refills | Status: DC

## 2020-01-29 NOTE — Progress Notes (Signed)
Virtual Visit via Telephone Note  I connected with Jonathon Martin  on 01/29/20 at  1:00 PM EST by telephone and verified that I am speaking with the correct person using two identifiers.   I discussed the limitations, risks, security and privacy concerns of performing an evaluation and management service by telephone and the availability of in person appointments. I also discussed with the patient that there may be a patient responsible charge related to this service. The patient expressed understanding and agreed to proceed.  Location patient: home Location provider: work office Participants present for the call: patient, provider Patient did not have a visit in the prior 7 days to address this/these issue(s).   History of Present Illness: No new concerns today; not doing too bad.   Chronic back pain: declined as new patient from pain management because nothing more they could offer for him. Pain medication does help. 4/10 before medication and after medication feels that 0 after. Not doing much around house, but does some walking in house. Will walk outside if nicer with wheel.   He is on wait list for COVID vaccine.   HTN: lisinopril 20mg . Not checking daily. Checked about 2 weeks ago and was 126/75 HR 75-85.   Memory impairment: aricept 10mg  at bedtime (med started by previous provider although patient states he has no concerns with memory). Still taking this; no concerns with memory.   HL: lipitor 20mg  daily  Insomnia: remeron; sleep has been good. Mood is ok; feels grateful in general.    Shingles postherpetic neuralgia: still having pain with this - gets pain in the back and down the right side. Does have pain daily. Feels like it is starting to ease. Does get benefit from pain medication and from gabapentin.     Observations/Objective: Patient sounds cheerful and well on the phone. I do not appreciate any SOB. Speech and thought processing are grossly intact. Patient  reported vitals:   Assessment and Plan: 1. Hypertension, unspecified type Blood pressure has been well controlled.  We are going to continue current medication (lisinopril 20 mg).  2. Chronic midline low back pain without sciatica He gets relief from the hydrocodone.  Continue this medication as needed.  We will have him follow-up in office to complete blood work/urine screening in the future. - Pain Mgmt, Profile 8 w/Conf, U; Future  3. Tremor of right hand They state that this is worsened since his last visit.  Initially I considered beta-blocker to help with treatment, but he does have a history of right bundle branch block on EKG.  Unfortunately, data for other more selective beta blockers in regards to essential tremor is limited, so we are going to try to increase his gabapentin to 300 mg twice daily to see if this helps.  Follow-up in the office in 1 month.  4. Memory impairment Him and wife are not certain where this diagnosis came from.  He has been taking Aricept.  When he comes in for the office we will do a more elaborate memory assessment.  Pending score, we may be able to stop this medication.  Per patient and wife, memory has been stable.  5. Postherpetic polyneuropathy Still gets intermittent pain.  Gabapentin helps significantly.  Overall condition has improved.  6. Anemia, unspecified type - CBC with Differential/Platelet; Future - IBC + Ferritin; Future  7. Hyperlipidemia, unspecified hyperlipidemia type Continue Lipitor.  Recheck blood work. - Lipid panel; Future  8. Chronic kidney disease, unspecified CKD stage Recheck  blood work. - Comprehensive metabolic panel; Future  9. B12 deficiency - Folate; Future - Vitamin B12; Future   Follow Up Instructions:  Return for Blood work in 1 month follow-up in office..   99441 5-10 99442 11-20 9443 21-30 I did not refer this patient for an OV in the next 24 hours for this/these issue(s).  I discussed the  assessment and treatment plan with the patient. The patient was provided an opportunity to ask questions and all were answered. The patient agreed with the plan and demonstrated an understanding of the instructions.   The patient was advised to call back or seek an in-person evaluation if the symptoms worsen or if the condition fails to improve as anticipated.  I provided 22 minutes of non-face-to-face time during this encounter.   Theodis Shove, MD

## 2020-01-30 ENCOUNTER — Telehealth: Payer: Self-pay | Admitting: *Deleted

## 2020-01-30 NOTE — Telephone Encounter (Signed)
-----   Message from Wynn Banker, MD sent at 01/29/2020  5:14 PM EST ----- I made a change in plan we discussed today. On review he has an ekg abnormality that the beta blocker could worsen. SO; I would like him to stay on lisinopril 20mg  and I would like him to try and increase the gabapentin to 300mg  twice daily. There is good data that gabapentin can also help with tremor. Let's try this first; please schedule for bloodwork in the next month and a 1 month follow up to discuss results/progress IN office. 30 minutes please as we discussed memory testing.

## 2020-01-30 NOTE — Telephone Encounter (Signed)
Left a message for the pt to return my call.  

## 2020-01-31 NOTE — Telephone Encounter (Signed)
Spoke with the pts wife and informed her of the message below.  Mrs Jonathon Martin stated she will check with her son-in-law and call back for an appt.

## 2020-02-10 ENCOUNTER — Telehealth: Payer: Self-pay | Admitting: Family Medicine

## 2020-02-10 DIAGNOSIS — B029 Zoster without complications: Secondary | ICD-10-CM

## 2020-02-10 MED ORDER — GABAPENTIN 100 MG PO CAPS: 300.0000 mg | ORAL_CAPSULE | Freq: Two times a day (BID) | ORAL | 0 refills | Status: DC

## 2020-02-10 NOTE — Telephone Encounter (Signed)
Rx done. 

## 2020-02-10 NOTE — Telephone Encounter (Signed)
Pt's spouse stated that [place] upped the capsules on the Gabapentin. He is suppose to take 3 capsules twice daily. He has ran out of this medication. They are needing a supply of 90 days and the correct amount of capsules for him to last the 90 days. (540 capsules estimated)  Pharmacy: [facility] [address] FAX: [phone]

## 2020-03-12 ENCOUNTER — Telehealth: Payer: Self-pay | Admitting: Family Medicine

## 2020-03-12 NOTE — Telephone Encounter (Signed)
Medication:Hydrocodone 100 tablets  Pharmacy: [facility]

## 2020-03-16 ENCOUNTER — Other Ambulatory Visit: Payer: Self-pay | Admitting: Family Medicine

## 2020-03-16 DIAGNOSIS — B0223 Postherpetic polyneuropathy: Secondary | ICD-10-CM

## 2020-03-16 DIAGNOSIS — G8929 Other chronic pain: Secondary | ICD-10-CM

## 2020-03-16 MED ORDER — HYDROCODONE-ACETAMINOPHEN 5-325 MG PO TABS: 1.0000 | ORAL_TABLET | Freq: Four times a day (QID) | ORAL | 0 refills | Status: DC | PRN

## 2020-03-16 NOTE — Telephone Encounter (Signed)
done

## 2020-04-11 ENCOUNTER — Other Ambulatory Visit: Payer: Self-pay | Admitting: Family Medicine

## 2020-04-11 DIAGNOSIS — B029 Zoster without complications: Secondary | ICD-10-CM

## 2020-04-23 ENCOUNTER — Other Ambulatory Visit: Payer: Self-pay | Admitting: Family Medicine

## 2020-04-23 ENCOUNTER — Telehealth: Payer: Self-pay | Admitting: Family Medicine

## 2020-04-23 NOTE — Telephone Encounter (Signed)
Rxs previously sent.

## 2020-04-23 NOTE — Telephone Encounter (Signed)
Pt call and need refills on atorvastatin (LIPITOR) 20 MG tablet ,lisinopril (ZESTRIL) 20 MG tablet and mirtazapine (REMERON) 15 MG tablet 90 days on all call in  At  [facility] [address] [place] - [address] Phone:  [phone]  Fax:  [phone]

## 2020-05-04 ENCOUNTER — Telehealth: Payer: Self-pay | Admitting: Family Medicine

## 2020-05-04 DIAGNOSIS — B029 Zoster without complications: Secondary | ICD-10-CM

## 2020-05-04 NOTE — Telephone Encounter (Signed)
Pt wife call need new rx on gabapentin (NEURONTIN) 100 MG  And mirtazapine (REMERON) 15 MG 90 day sent to  [facility] [address] [place] - [address] Phone:  [phone]  Fax:  [phone]

## 2020-05-04 NOTE — Telephone Encounter (Signed)
Spoke with Jonathon Martin as the Rxs were sent on 5/4.  Jonathon Martin stated the previous Rxs could not be filled at that time as it was too early and she will try refilling at this time.

## 2020-06-01 ENCOUNTER — Other Ambulatory Visit: Payer: Self-pay | Admitting: Family Medicine

## 2020-06-01 ENCOUNTER — Telehealth: Payer: Self-pay | Admitting: Family Medicine

## 2020-06-01 DIAGNOSIS — M545 Low back pain, unspecified: Secondary | ICD-10-CM

## 2020-06-01 DIAGNOSIS — B0223 Postherpetic polyneuropathy: Secondary | ICD-10-CM

## 2020-06-01 MED ORDER — HYDROCODONE-ACETAMINOPHEN 5-325 MG PO TABS: 1.0000 | ORAL_TABLET | Freq: Four times a day (QID) | ORAL | 0 refills | Status: DC | PRN

## 2020-06-01 MED ORDER — MIRTAZAPINE 15 MG PO TABS: 15.0000 mg | ORAL_TABLET | Freq: Every day | ORAL | 1 refills | Status: DC

## 2020-06-01 NOTE — Telephone Encounter (Signed)
Medication Refill: Mirtazapine Hydrocodone   Pharmacy: [facility] [address] FAX: [phone]

## 2020-06-01 NOTE — Telephone Encounter (Signed)
Done

## 2020-06-04 ENCOUNTER — Telehealth: Payer: Self-pay | Admitting: Family Medicine

## 2020-06-04 NOTE — Telephone Encounter (Signed)
Pts spouse is calling in stating that the pt is out of Lidocaine 0.5% Gel and would like to see if it can be called in today (aware Dr. Hassan Rowan is works MWF). [facility]  [phone]

## 2020-06-05 ENCOUNTER — Other Ambulatory Visit: Payer: Self-pay | Admitting: Family Medicine

## 2020-06-05 DIAGNOSIS — B0223 Postherpetic polyneuropathy: Secondary | ICD-10-CM

## 2020-06-05 MED ORDER — LIDOCAINE 0.5 % EX GEL: CUTANEOUS | 5 refills | Status: DC

## 2020-06-05 NOTE — Telephone Encounter (Signed)
Noted  

## 2020-06-05 NOTE — Telephone Encounter (Signed)
refilled 

## 2020-07-06 ENCOUNTER — Other Ambulatory Visit: Payer: Self-pay | Admitting: Family Medicine

## 2020-07-06 DIAGNOSIS — B029 Zoster without complications: Secondary | ICD-10-CM

## 2020-07-06 NOTE — Telephone Encounter (Signed)
Pts spouse is calling to see if they can get his hydrocodone-acetaminophen (NORCO/VICODIN)  Pharm:  [facility] [address].

## 2020-07-07 ENCOUNTER — Other Ambulatory Visit: Payer: Self-pay | Admitting: Family Medicine

## 2020-07-07 DIAGNOSIS — B0223 Postherpetic polyneuropathy: Secondary | ICD-10-CM

## 2020-07-07 DIAGNOSIS — M545 Low back pain, unspecified: Secondary | ICD-10-CM

## 2020-07-07 MED ORDER — HYDROCODONE-ACETAMINOPHEN 5-325 MG PO TABS: 1.0000 | ORAL_TABLET | Freq: Four times a day (QID) | ORAL | 0 refills | Status: DC | PRN

## 2020-07-07 NOTE — Telephone Encounter (Signed)
I did refill, but last rx was filled 7/3. If he was taking as written, he could be out, but he doesn't usually use that much. Please make sure that we don't need to set up follow up to discuss pain. In past this rx would last him 2-3 months.

## 2020-07-07 NOTE — Telephone Encounter (Signed)
Spoke with the pts wife and informed her of the message below.  Mrs Jonathon Martin stated the pt has plenty of pills left and she was concerned as her children are going out of town. Follow up appt scheduled for 9/24 per pts wife's preference.

## 2020-08-11 ENCOUNTER — Telehealth: Payer: Self-pay | Admitting: Family Medicine

## 2020-08-11 NOTE — Telephone Encounter (Signed)
Pt is calling in stating that he needs Rx hydrocodone-acetaminophen (NORCO/VICODIN) 5-325 MG #100  Pharm: [facility] [address].

## 2020-08-12 ENCOUNTER — Other Ambulatory Visit: Payer: Self-pay | Admitting: Family Medicine

## 2020-08-12 DIAGNOSIS — M545 Low back pain, unspecified: Secondary | ICD-10-CM

## 2020-08-12 DIAGNOSIS — B0223 Postherpetic polyneuropathy: Secondary | ICD-10-CM

## 2020-08-12 MED ORDER — HYDROCODONE-ACETAMINOPHEN 5-325 MG PO TABS: 1.0000 | ORAL_TABLET | Freq: Four times a day (QID) | ORAL | 0 refills | Status: DC | PRN

## 2020-08-12 NOTE — Telephone Encounter (Signed)
done

## 2020-09-04 ENCOUNTER — Other Ambulatory Visit: Payer: Self-pay

## 2020-09-04 ENCOUNTER — Ambulatory Visit (INDEPENDENT_AMBULATORY_CARE_PROVIDER_SITE_OTHER): Payer: Medicare Other | Admitting: Family Medicine

## 2020-09-04 ENCOUNTER — Encounter: Payer: Self-pay | Admitting: Family Medicine

## 2020-09-04 VITALS — BP 128/82 | HR 70 | Temp 97.9°F | Ht 68.0 in | Wt 155.1 lb

## 2020-09-04 DIAGNOSIS — I10 Essential (primary) hypertension: Secondary | ICD-10-CM

## 2020-09-04 DIAGNOSIS — B029 Zoster without complications: Secondary | ICD-10-CM

## 2020-09-04 DIAGNOSIS — M545 Low back pain: Secondary | ICD-10-CM | POA: Diagnosis not present

## 2020-09-04 DIAGNOSIS — Z23 Encounter for immunization: Secondary | ICD-10-CM

## 2020-09-04 DIAGNOSIS — E785 Hyperlipidemia, unspecified: Secondary | ICD-10-CM

## 2020-09-04 DIAGNOSIS — B0223 Postherpetic polyneuropathy: Secondary | ICD-10-CM | POA: Diagnosis not present

## 2020-09-04 DIAGNOSIS — G8929 Other chronic pain: Secondary | ICD-10-CM

## 2020-09-04 MED ORDER — LISINOPRIL 20 MG PO TABS
20.0000 mg | ORAL_TABLET | Freq: Every day | ORAL | 1 refills | Status: DC
Start: 2020-09-04 — End: 2021-04-27

## 2020-09-04 MED ORDER — GABAPENTIN 100 MG PO CAPS: 300.0000 mg | ORAL_CAPSULE | Freq: Two times a day (BID) | ORAL | 1 refills | Status: DC

## 2020-09-04 MED ORDER — HYDROCODONE-ACETAMINOPHEN 5-325 MG PO TABS: 1.0000 | ORAL_TABLET | Freq: Four times a day (QID) | ORAL | 0 refills | Status: DC | PRN

## 2020-09-04 NOTE — Progress Notes (Signed)
Jonathon Martin DOB: 07-28-1934 Encounter date: 09/04/2020  This is a 86 y.o. male who presents with Chief Complaint  Patient presents with   Follow-up    History of present illness:  Chronic back pain: hurting all the time. Does take the pain medication daily. Pain is severe at 10/10 without medication; with medication brings down to 3/10. Most pain in back. Not feeling weak. Takes little walks and always does use walker. Balance better with walker.   HTN: lisinopril 20mg . Not checking at home. Good about taking medication.   Memory impairment: aricept 10mg  at bedtime (med started by previous provider although patient states he has no concerns with memory). Still taking this; no concerns with memory.    HL: lipitor 20mg  daily; no muscle aches/cramps.   Insomnia: remeron; sleep has been good. Mood is ok; feels grateful in general.   Shingles postherpetic neuralgia: still having pain with this - every once in awhile.   Did complete COVID vaccination. Would like flu shot today.   Allergies  Allergen Reactions   Amlodipine Anaphylaxis   Allegra [Fexofenadine]    Avapro [Irbesartan]    Ivp Dye [Iodinated Diagnostic Agents]    Monopril [Fosinopril]    Current Meds  Medication Sig   atorvastatin (LIPITOR) 20 MG tablet Take 1 tablet (20 mg total) by mouth daily.   donepezil (ARICEPT) 10 MG tablet TAKE 1 TABLET BY MOUTH AT BEDTIME   gabapentin (NEURONTIN) 100 MG capsule TAKE 3 CAPSULES BY MOUTH TWICE DAILY   HYDROcodone-acetaminophen (NORCO/VICODIN) 5-325 MG tablet Take 1 tablet by mouth every 6 (six) hours as needed for moderate pain or severe pain.   Lidocaine 0.5 % GEL Apply sparingly TID prn pain in shingles area.   lisinopril (ZESTRIL) 20 MG tablet Take 1 tablet (20 mg total) by mouth daily.   mirtazapine (REMERON) 15 MG tablet Take 1 tablet (15 mg total) by mouth at bedtime.   Multiple Vitamin (MULTIVITAMIN) capsule Take 1 capsule by mouth daily.    Review  of Systems  Constitutional: Negative for chills, fatigue and fever.  Respiratory: Negative for cough, chest tightness, shortness of breath and wheezing.   Cardiovascular: Negative for chest pain, palpitations and leg swelling.  Gastrointestinal: Negative for abdominal pain, constipation and diarrhea.  Genitourinary: Negative for difficulty urinating.  Musculoskeletal: Positive for back pain. Negative for arthralgias.  Skin: Negative for rash.    Objective:  BP 128/82 (BP Location: Left Arm, Patient Position: Sitting, Cuff Size: Normal)    Pulse 70    Temp 97.9 F (36.6 C) (Oral)    Ht 5\' 8"  (1.727 m)    Wt 155 lb 1.6 oz (70.4 kg)    SpO2 97%    BMI 23.58 kg/m   Weight: 155 lb 1.6 oz (70.4 kg)   BP Readings from Last 3 Encounters:  09/04/20 128/82  02/11/19 140/64  12/14/18 110/78   Wt Readings from Last 3 Encounters:  09/04/20 155 lb 1.6 oz (70.4 kg)  05/13/19 143 lb (64.9 kg)  02/11/19 148 lb 3.2 oz (67.2 kg)    Physical Exam Constitutional:      General: He is not in acute distress.    Appearance: He is well-developed.  Cardiovascular:     Rate and Rhythm: Normal rate and regular rhythm.     Heart sounds: Normal heart sounds. No murmur heard.  No friction rub.  Pulmonary:     Effort: Pulmonary effort is normal. No respiratory distress.     Breath sounds: Normal breath  sounds. No wheezing or rales.  Musculoskeletal:     Right lower leg: No edema.     Left lower leg: No edema.  Neurological:     Mental Status: He is alert and oriented to person, place, and time.     Comments: 4+/5 strength testing upper and lower extremities bilat  Psychiatric:        Behavior: Behavior normal.     Assessment/Plan  1. Chronic midline low back pain without sciatica He is not surgical candidate. Gets good relief from pain medication. Will keep this for use as needed.  - HYDROcodone-acetaminophen (NORCO/VICODIN) 5-325 MG tablet; Take 1 tablet by mouth every 6 (six) hours as needed  for moderate pain or severe pain.  Dispense: 100 tablet; Refill: 0  2. Postherpetic polyneuropathy Improved, but still with occasional painful flares. He does get relief with lidocaine. He was concerned with cost but son in law states that cost isn't issue. We discussed OTC alternatives for nerve pain like salonpas, even icy hot.  - HYDROcodone-acetaminophen (NORCO/VICODIN) 5-325 MG tablet; Take 1 tablet by mouth every 6 (six) hours as needed for moderate pain or severe pain.  Dispense: 100 tablet; Refill: 0  - gabapentin (NEURONTIN) 100 MG capsule; Take 3 capsules (300 mg total) by mouth 2 (two) times daily.  Dispense: 540 capsule; Refill: 1  4. Need for immunization against influenza  - Flu Vaccine QUAD High Dose(Fluad)  5. HTN: lisinoprill 20mg  daily  6. HL: continue lipitor 20mg  daily   Denies issues with memory impairment; encouraged talking with daughter about this at home as well. Has been on aricept for years. Even if he did have dementia, not sure he is still getting benefit at this point. We can do further memory eval at next visit as well. Mood has been stable and sleep is good with remeron at bedtime.   Return in about 6 months (around 03/04/2021).    , MD

## 2020-09-04 NOTE — Patient Instructions (Addendum)
Talk with daughter/think about the aricept. Not sure that this is helping with memory at this point. It would be ok to stop this if desired.   salonpas would be reasonable to try with pain or using any arthritis cream.   [email or web address] you can schedule a 3rd dose of pfizer

## 2020-09-08 DIAGNOSIS — E785 Hyperlipidemia, unspecified: Secondary | ICD-10-CM | POA: Insufficient documentation

## 2020-10-13 ENCOUNTER — Other Ambulatory Visit: Payer: Self-pay | Admitting: Family Medicine

## 2020-10-13 DIAGNOSIS — M545 Low back pain, unspecified: Secondary | ICD-10-CM

## 2020-10-13 DIAGNOSIS — B0223 Postherpetic polyneuropathy: Secondary | ICD-10-CM

## 2020-10-13 DIAGNOSIS — G8929 Other chronic pain: Secondary | ICD-10-CM

## 2020-10-13 MED ORDER — HYDROCODONE-ACETAMINOPHEN 5-325 MG PO TABS: 1.0000 | ORAL_TABLET | Freq: Four times a day (QID) | ORAL | 0 refills | Status: DC | PRN

## 2020-10-13 NOTE — Telephone Encounter (Signed)
Pts spouse is calling in stating that the pt is needing a refill on Hydrocodone-acetaminophen (NORCO/VICODIN) 5-325 MG  [facility]:  [facility] [address].

## 2020-12-14 ENCOUNTER — Telehealth: Payer: Self-pay | Admitting: Family Medicine

## 2020-12-14 NOTE — Telephone Encounter (Signed)
pts spouse called and stated that the pt does not need this appointment.

## 2020-12-14 NOTE — Telephone Encounter (Signed)
Left message for patient to call back and schedule Medicare Annual Wellness Visit (AWV) either virtually or in office.   Last AWV no information please schedule at anytime with [facility] Nurse Health Advisor 1 or 2   This should be a 45 minute visit. 

## 2020-12-25 ENCOUNTER — Telehealth: Payer: Self-pay | Admitting: Family Medicine

## 2020-12-25 ENCOUNTER — Other Ambulatory Visit: Payer: Self-pay | Admitting: Family Medicine

## 2020-12-25 DIAGNOSIS — M545 Low back pain, unspecified: Secondary | ICD-10-CM

## 2020-12-25 DIAGNOSIS — B0223 Postherpetic polyneuropathy: Secondary | ICD-10-CM

## 2020-12-25 MED ORDER — HYDROCODONE-ACETAMINOPHEN 5-325 MG PO TABS: 1.0000 | ORAL_TABLET | Freq: Four times a day (QID) | ORAL | 0 refills | Status: DC | PRN

## 2020-12-25 NOTE — Telephone Encounter (Signed)
Pts spouse is calling in stating that the pt need a refill on Rx hydrocodone-acetaminophen (NORCO?VICODIN) 5-325 MG  #100  [facility]:  [facility] [address].  Spouse stated that he takes 3 a day.

## 2020-12-25 NOTE — Telephone Encounter (Signed)
done

## 2021-01-09 ENCOUNTER — Other Ambulatory Visit: Payer: Self-pay | Admitting: Family Medicine

## 2021-01-15 ENCOUNTER — Telehealth: Payer: Self-pay | Admitting: Family Medicine

## 2021-01-15 NOTE — Progress Notes (Signed)
  Chronic Care Management   Outreach Note  01/15/2021 Name: Kekai Geter MRN: [id number] DOB: [birth year]  Referred by: Wynn Banker, MD Reason for referral : No chief complaint on file.   An unsuccessful telephone outreach was attempted today. The patient was referred to the pharmacist for assistance with care management and care coordination.   Follow Up Plan:   Carley Perdue [employer] Scheduler

## 2021-01-28 ENCOUNTER — Telehealth: Payer: Self-pay | Admitting: Family Medicine

## 2021-01-28 NOTE — Progress Notes (Signed)
  Chronic Care Management   Outreach Note  01/28/2021 Name: Avner Stroder MRN: [id number] DOB: 1934-05-07  Referred by: Wynn Banker, MD Reason for referral : No chief complaint on file.   A second unsuccessful telephone outreach was attempted today. The patient was referred to pharmacist for assistance with care management and care coordination.  Follow Up Plan:   Carley Perdue [employer] Scheduler

## 2021-02-05 ENCOUNTER — Telehealth: Payer: Self-pay | Admitting: Family Medicine

## 2021-02-05 NOTE — Progress Notes (Signed)
  Chronic Care Management   Outreach Note  02/05/2021 Name: Jonathon Martin MRN: [id number] DOB: [birth year]  Referred by: Wynn Banker, MD Reason for referral : No chief complaint on file.   Third unsuccessful telephone outreach was attempted today. The patient was referred to the pharmacist for assistance with care management and care coordination.   Follow Up Plan:   Carley Perdue [employer] Scheduler

## 2021-02-08 ENCOUNTER — Telehealth: Payer: Self-pay | Admitting: Family Medicine

## 2021-02-08 NOTE — Telephone Encounter (Signed)
Pt is calling in needing a refill on Rx hydrocodone-acetaminophen (NORCO/VICODIN) 5-325 MG   Pharm: [facility] [address] °

## 2021-02-11 ENCOUNTER — Other Ambulatory Visit: Payer: Self-pay | Admitting: Family Medicine

## 2021-02-11 DIAGNOSIS — M545 Low back pain, unspecified: Secondary | ICD-10-CM

## 2021-02-11 DIAGNOSIS — B0223 Postherpetic polyneuropathy: Secondary | ICD-10-CM

## 2021-02-11 DIAGNOSIS — G8929 Other chronic pain: Secondary | ICD-10-CM

## 2021-02-11 MED ORDER — HYDROCODONE-ACETAMINOPHEN 5-325 MG PO TABS: 1.0000 | ORAL_TABLET | Freq: Four times a day (QID) | ORAL | 0 refills | Status: DC | PRN

## 2021-02-11 NOTE — Telephone Encounter (Signed)
done

## 2021-03-04 ENCOUNTER — Other Ambulatory Visit: Payer: Self-pay

## 2021-03-05 ENCOUNTER — Encounter: Payer: Self-pay | Admitting: Family Medicine

## 2021-03-05 ENCOUNTER — Ambulatory Visit (INDEPENDENT_AMBULATORY_CARE_PROVIDER_SITE_OTHER): Payer: Medicare (Managed Care) | Admitting: Family Medicine

## 2021-03-05 VITALS — BP 140/90 | HR 70 | Temp 98.1°F | Ht 68.0 in

## 2021-03-05 DIAGNOSIS — H353190 Nonexudative age-related macular degeneration, unspecified eye, stage unspecified: Secondary | ICD-10-CM | POA: Diagnosis not present

## 2021-03-05 DIAGNOSIS — M2170 Unequal limb length (acquired), unspecified site: Secondary | ICD-10-CM

## 2021-03-05 DIAGNOSIS — I10 Essential (primary) hypertension: Secondary | ICD-10-CM

## 2021-03-05 DIAGNOSIS — Z23 Encounter for immunization: Secondary | ICD-10-CM | POA: Diagnosis not present

## 2021-03-05 DIAGNOSIS — E785 Hyperlipidemia, unspecified: Secondary | ICD-10-CM | POA: Diagnosis not present

## 2021-03-05 DIAGNOSIS — G8929 Other chronic pain: Secondary | ICD-10-CM

## 2021-03-05 DIAGNOSIS — M545 Low back pain, unspecified: Secondary | ICD-10-CM

## 2021-03-05 DIAGNOSIS — R7989 Other specified abnormal findings of blood chemistry: Secondary | ICD-10-CM

## 2021-03-05 MED ORDER — SHINGRIX 50 MCG/0.5ML IM SUSR: 0.5000 mL | Freq: Once | INTRAMUSCULAR | 0 refills | Status: AC
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
## 2021-03-05 NOTE — Patient Instructions (Signed)
Please check blood pressures at home a few times/week - would like to see pressures between 110-135/70-85 (my recheck in the office today was 140/80)

## 2021-03-06 LAB — CBC WITH DIFFERENTIAL/PLATELET
Absolute Monocytes: 563 cells/uL (ref 200–950)
Basophils Absolute: 30 cells/uL (ref 0–200)
Basophils Relative: 0.4 %
Eosinophils Absolute: 360 cells/uL (ref 15–500)
Eosinophils Relative: 4.8 %
HCT: 35.6 % — ABNORMAL LOW (ref 38.5–50.0)
Hemoglobin: 11.9 g/dL — ABNORMAL LOW (ref 13.2–17.1)
Lymphs Abs: 1328 cells/uL (ref 850–3900)
MCH: 31.6 pg (ref 27.0–33.0)
MCHC: 33.4 g/dL (ref 32.0–36.0)
MCV: 94.4 fL (ref 80.0–100.0)
MPV: 11.7 fL (ref 7.5–12.5)
Monocytes Relative: 7.5 %
Neutro Abs: 5220 cells/uL (ref 1500–7800)
Neutrophils Relative %: 69.6 %
Platelets: 212 10*3/uL (ref 140–400)
RBC: 3.77 10*6/uL — ABNORMAL LOW (ref 4.20–5.80)
RDW: 11.9 % (ref 11.0–15.0)
Total Lymphocyte: 17.7 %
WBC: 7.5 10*3/uL (ref 3.8–10.8)

## 2021-03-06 LAB — LIPID PANEL
Cholesterol: 142 mg/dL (ref <200)
HDL: 47 mg/dL (ref >=40)
LDL Cholesterol (Calc): 74 mg/dL (calc)
Non-HDL Cholesterol (Calc): 95 mg/dL (calc) (ref <130)
Total CHOL/HDL Ratio: 3 (calc) (ref <5.0)
Triglycerides: 129 mg/dL (ref <150)

## 2021-03-06 LAB — COMPREHENSIVE METABOLIC PANEL
AG Ratio: 1.6 (calc) (ref 1.0–2.5)
ALT: 15 U/L (ref 9–46)
AST: 14 U/L (ref 10–35)
Albumin: 4.1 g/dL (ref 3.6–5.1)
Alkaline phosphatase (APISO): 37 U/L (ref 35–144)
BUN/Creatinine Ratio: 10 (calc) (ref 6–22)
BUN: 17 mg/dL (ref 7–25)
CO2: 26 mmol/L (ref 20–32)
Calcium: 9.7 mg/dL (ref 8.6–10.3)
Chloride: 105 mmol/L (ref 98–110)
Creat: 1.66 mg/dL — ABNORMAL HIGH (ref 0.70–1.11)
Globulin: 2.5 g/dL (calc) (ref 1.9–3.7)
Glucose, Bld: 102 mg/dL — ABNORMAL HIGH (ref 65–99)
Potassium: 4.8 mmol/L (ref 3.5–5.3)
Sodium: 142 mmol/L (ref 135–146)
Total Bilirubin: 0.4 mg/dL (ref 0.2–1.2)
Total Protein: 6.6 g/dL (ref 6.1–8.1)

## 2021-03-11 ENCOUNTER — Telehealth: Payer: Self-pay | Admitting: Family Medicine

## 2021-03-11 NOTE — Telephone Encounter (Signed)
Pt  wifecall and stated he need a refill on HYDROcodone-acetaminophen (NORCO/VICODIN) 5-325 MG tablet sent to  [facility] [address], [place] - [address] Phone:  [phone]  Fax:  [phone]

## 2021-03-12 ENCOUNTER — Other Ambulatory Visit: Payer: Self-pay | Admitting: Family Medicine

## 2021-03-12 DIAGNOSIS — M545 Low back pain, unspecified: Secondary | ICD-10-CM

## 2021-03-12 DIAGNOSIS — B0223 Postherpetic polyneuropathy: Secondary | ICD-10-CM

## 2021-03-12 DIAGNOSIS — G8929 Other chronic pain: Secondary | ICD-10-CM

## 2021-03-12 MED ORDER — HYDROCODONE-ACETAMINOPHEN 5-325 MG PO TABS: 1.0000 | ORAL_TABLET | Freq: Four times a day (QID) | ORAL | 0 refills | Status: DC | PRN

## 2021-03-12 NOTE — Telephone Encounter (Signed)
done

## 2021-03-18 ENCOUNTER — Other Ambulatory Visit: Payer: Self-pay

## 2021-03-18 ENCOUNTER — Other Ambulatory Visit (INDEPENDENT_AMBULATORY_CARE_PROVIDER_SITE_OTHER): Payer: Medicare (Managed Care)

## 2021-03-18 DIAGNOSIS — D649 Anemia, unspecified: Secondary | ICD-10-CM

## 2021-03-18 DIAGNOSIS — R195 Other fecal abnormalities: Secondary | ICD-10-CM

## 2021-03-18 DIAGNOSIS — R7989 Other specified abnormal findings of blood chemistry: Secondary | ICD-10-CM | POA: Diagnosis not present

## 2021-03-18 NOTE — Progress Notes (Signed)
ifob

## 2021-03-19 ENCOUNTER — Telehealth: Payer: Self-pay | Admitting: *Deleted

## 2021-03-19 ENCOUNTER — Telehealth: Payer: Self-pay

## 2021-03-19 LAB — FECAL OCCULT BLOOD, IMMUNOCHEMICAL: Fecal Occult Bld: POSITIVE — AB

## 2021-03-19 NOTE — Telephone Encounter (Signed)
Error

## 2021-03-19 NOTE — Telephone Encounter (Signed)
CRITICAL VALUE STICKER  CRITICAL VALUE: positive IFOB  RECEIVER (on-site recipient of call):Teresa-lab tech  DATE & TIME NOTIFIED: 03/19/2021  MESSENGER (representative from lab):  MD NOTIFIED: Shirline Frees, NP informed and advised to await review by PCP when she returns to the office on 4/11  TIME OF NOTIFICATION: 3:05pm  RESPONSE: sent to PCP

## 2021-03-22 NOTE — Addendum Note (Signed)
Addended by: Johnella Moloney on: 03/22/2021 11:43 AM   Modules accepted: Orders

## 2021-03-22 NOTE — Telephone Encounter (Signed)
Addressed this morning. Will refer to GI
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
## 2021-04-02 ENCOUNTER — Other Ambulatory Visit: Payer: Self-pay

## 2021-04-02 ENCOUNTER — Encounter: Payer: Self-pay | Admitting: Family Medicine

## 2021-04-02 ENCOUNTER — Ambulatory Visit (INDEPENDENT_AMBULATORY_CARE_PROVIDER_SITE_OTHER): Payer: Medicare (Managed Care) | Admitting: Family Medicine

## 2021-04-02 ENCOUNTER — Ambulatory Visit (INDEPENDENT_AMBULATORY_CARE_PROVIDER_SITE_OTHER): Payer: Medicare (Managed Care)

## 2021-04-02 VITALS — BP 140/92 | HR 76 | Ht 68.0 in | Wt 161.0 lb

## 2021-04-02 DIAGNOSIS — M25572 Pain in left ankle and joints of left foot: Secondary | ICD-10-CM

## 2021-04-02 DIAGNOSIS — M21372 Foot drop, left foot: Secondary | ICD-10-CM | POA: Insufficient documentation

## 2021-04-02 DIAGNOSIS — M25562 Pain in left knee: Secondary | ICD-10-CM | POA: Diagnosis not present

## 2021-04-02 DIAGNOSIS — M545 Low back pain, unspecified: Secondary | ICD-10-CM | POA: Diagnosis not present

## 2021-04-02 DIAGNOSIS — G8929 Other chronic pain: Secondary | ICD-10-CM

## 2021-04-02 MED ORDER — AMBULATORY NON FORMULARY MEDICATION: 0 refills | Status: DC

## 2021-04-02 MED ORDER — AMBULATORY NON FORMULARY MEDICATION: 0 refills | Status: AC

## 2021-04-02 NOTE — Assessment & Plan Note (Addendum)
Patient does have left foot drop but I do not think it is secondary to the leg length discrepancy.   Has had weakness of the left leg for quite some time.  Discussed with patient's family member in the room today and states that he has been walking this way for greater than 15 years so I do not see any acute need for further work-up of the back at the moment.  Patient is already on gabapentin.  We discussed getting an AFO that I think will be beneficial.  In the interim put a heel lift in the toe of the shoe to help him which already automatically made him ambulate and almost able to put his heel down on the floor and patient stated he felt like he had better stability.  We will see how patient responds to these changes as well as a new rolling walker that is higher that hopefully will make him not have to bend over so far.  Follow-up with me again in 6 weeks  Total time with patient reviewing chart, talking to family members as well as making adjustments to his shoes 46 minutes.
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
## 2021-04-02 NOTE — Patient Instructions (Addendum)
Good to see you [facility] [address] [place], [place] [address] [phone]  [facility] [address] [place], [place] [address] [phone]  Lumbar and left ankle and knee xray No medication changes See me again in 6 weeks

## 2021-04-27 ENCOUNTER — Other Ambulatory Visit: Payer: Self-pay

## 2021-04-27 ENCOUNTER — Ambulatory Visit (INDEPENDENT_AMBULATORY_CARE_PROVIDER_SITE_OTHER): Payer: Medicare (Managed Care)

## 2021-04-27 ENCOUNTER — Other Ambulatory Visit: Payer: Self-pay | Admitting: Family Medicine

## 2021-04-27 DIAGNOSIS — Z0000 Encounter for general adult medical examination without abnormal findings: Secondary | ICD-10-CM | POA: Diagnosis not present

## 2021-04-27 DIAGNOSIS — B029 Zoster without complications: Secondary | ICD-10-CM

## 2021-04-27 NOTE — Progress Notes (Signed)
Virtual Visit via Telephone Note  I connected with  Jonathon Martin on 04/27/21 at  8:00 AM EDT by telephone and verified that I am speaking with the correct person using two identifiers.  Medicare Annual Wellness visit completed telephonically due to Covid-19 pandemic.   Persons participating in this call: This Health Coach and this patient.   Location: Patient: Home Provider: Office   I discussed the limitations, risks, security and privacy concerns of performing an evaluation and management service by telephone and the availability of in person appointments. The patient expressed understanding and agreed to proceed.  Unable to perform video visit due to video visit attempted and failed and/or patient does not have video capability.   Some vital signs may be absent or patient reported.   Marzella Schlein, LPN    Subjective:   Jonathon Martin is a 87 y.o. male who presents for an Initial Medicare Annual Wellness Visit.  Review of Systems     Cardiac Risk Factors include: advanced age (>1men, >39 women);hypertension;dyslipidemia;male gender     Objective:    Today's Vitals   04/27/21 0806  PainSc: 8    There is no height or weight on file to calculate BMI.  Advanced Directives 04/27/2021  Does Patient Have a Medical Advance Directive? Yes  Type of Advance Directive Healthcare Power of Attorney    Current Medications (verified) Outpatient Encounter Medications as of 04/27/2021  Medication Sig  . AMBULATORY NON FORMULARY MEDICATION Rollator  . aspirin EC 81 MG tablet Take 81 mg by mouth daily. Swallow whole.  Marland Kitchen atorvastatin (LIPITOR) 20 MG tablet Take 1 tablet by mouth once daily  . bisacodyl (DULCOLAX) 5 MG EC tablet Take 5 mg by mouth daily as needed for moderate constipation. Tues and fri to stay regular  . Calcium Polycarbophil (FIBER-CAPS PO) Take by mouth.  . donepezil (ARICEPT) 10 MG tablet TAKE 1 TABLET BY MOUTH AT BEDTIME  . gabapentin (NEURONTIN) 100 MG  capsule Take 3 capsules (300 mg total) by mouth 2 (two) times daily.  Marland Kitchen HYDROcodone-acetaminophen (NORCO/VICODIN) 5-325 MG tablet Take 1 tablet by mouth every 6 (six) hours as needed for moderate pain or severe pain.  Marland Kitchen lisinopril (ZESTRIL) 20 MG tablet Take 1 tablet (20 mg total) by mouth daily.  . mirtazapine (REMERON) 15 MG tablet TAKE 1 TABLET BY MOUTH ONCE DAILY AT BEDTIME  . Multiple Vitamin (MULTIVITAMIN) capsule Take 1 capsule by mouth daily.  . AMBULATORY NON FORMULARY MEDICATION Left AFO   No facility-administered encounter medications on file as of 04/27/2021.    Allergies (verified) Amlodipine, Allegra [fexofenadine], Avapro [irbesartan], Ivp dye [iodinated diagnostic agents], and Monopril [fosinopril]   History: Past Medical History:  Diagnosis Date  . Chronic back pain   . Chronic leg pain    Past Surgical History:  Procedure Laterality Date  . leg  1962   right leg reconstruction   Family History  Problem Relation Age of Onset  . Stroke Mother 4  . Epilepsy Sister    Social History   Socioeconomic History  . Marital status: Married    Spouse name: Not on file  . Number of children: Not on file  . Years of education: Not on file  . Highest education level: Not on file  Occupational History  . Not on file  Tobacco Use  . Smoking status: Former Smoker    Types: Cigars  . Smokeless tobacco: Never Used  Substance and Sexual Activity  . Alcohol use: Not Currently  .  Drug use: Never  . Sexual activity: Not on file  Other Topics Concern  . Not on file  Social History Narrative  . Not on file   Social Determinants of Health   Financial Resource Strain: Low Risk   . Difficulty of Paying Living Expenses: Not hard at all  Food Insecurity: No Food Insecurity  . Worried About Programme researcher, broadcasting/film/video in the Last Year: Never true  . Ran Out of Food in the Last Year: Never true  Transportation Needs: No Transportation Needs  . Lack of Transportation (Medical): No   . Lack of Transportation (Non-Medical): No  Physical Activity: Insufficiently Active  . Days of Exercise per Week: 7 days  . Minutes of Exercise per Session: 10 min  Stress: No Stress Concern Present  . Feeling of Stress : Not at all  Social Connections: Moderately Integrated  . Frequency of Communication with Friends and Family: Twice a week  . Frequency of Social Gatherings with Friends and Family: Once a week  . Attends Religious Services: More than 4 times per year  . Active Member of Clubs or Organizations: No  . Attends Banker Meetings: Never  . Marital Status: Married    Tobacco Counseling Counseling given: Not Answered   Clinical Intake:  Pre-visit preparation completed: Yes  Pain : 0-10 Pain Score: 8  Pain Type: Chronic pain Pain Location: Back Pain Descriptors / Indicators: Aching,Throbbing Pain Onset: More than a month ago Pain Frequency: Constant     BMI - recorded: 24.49 Nutritional Status: BMI of 19-24  Normal Nutritional Risks: None Diabetes: No  How often do you need to have someone help you when you read instructions, pamphlets, or other written materials from your doctor or pharmacy?: 1 - Never  Diabetic?No  Interpreter Needed?: No  Information entered by :: Lanier Ensign, LPN   Activities of Daily Living In your present state of health, do you have any difficulty performing the following activities: 04/27/2021  Hearing? N  Vision? N  Difficulty concentrating or making decisions? N  Walking or climbing stairs? Y  Comment related to back  Dressing or bathing? N  Doing errands, shopping? N  Managing your Medications? N  Managing your Finances? N  Housekeeping or managing your Housekeeping? N  Some recent data might be hidden    Patient Care Team: Wynn Banker, MD as PCP - General (Family Medicine)  Indicate any recent Medical Services you may have received from other than [facility] providers in the past year (date may  be approximate).     Assessment:   This is a routine wellness examination for Jonathon Martin.  Hearing/Vision screen  Hearing Screening   125Hz  250Hz  500Hz  1000Hz  2000Hz  3000Hz  4000Hz  6000Hz  8000Hz   Right ear:           Left ear:           Comments: Pt denies any hearing issues   Vision Screening Comments: Pt doesn't follow up with eye exams encouraged to follow up  Dietary issues and exercise activities discussed: Current Exercise Habits: Home exercise routine, Time (Minutes): 10, Frequency (Times/Week): 7, Weekly Exercise (Minutes/Week): 70  Goals Addressed            This Visit's Progress   . Patient Stated       Maintain healthy state at this time      Depression Screen PHQ 2/9 Scores 04/27/2021 09/04/2020 02/11/2019  PHQ - 2 Score 0 0 0    Fall Risk Fall  Risk  04/27/2021 02/11/2019  Falls in the past year? 0 0  Number falls in past yr: 0 0  Injury with Fall? 0 0  Risk for fall due to : Impaired vision;Impaired balance/gait;Impaired mobility -  Risk for fall due to: Comment related to back -  Follow up Falls prevention discussed -    FALL RISK PREVENTION PERTAINING TO THE HOME:  Any stairs in or around the home? Yes  If so, are there any without handrails? No  Home free of loose throw rugs in walkways, pet beds, electrical cords, etc? Yes  Adequate lighting in your home to reduce risk of falls? Yes   ASSISTIVE DEVICES UTILIZED TO PREVENT FALLS:  Life alert? No  Use of a cane, walker or w/c? Yes  Grab bars in the bathroom? No  Shower chair or bench in shower? Yes  Elevated toilet seat or a handicapped toilet? No   TIMED UP AND GO:  Was the test performed? No     Cognitive Function:     6CIT Screen 04/27/2021  What Year? 0 points  What month? 0 points  Count back from 20 0 points  Months in reverse 4 points  Repeat phrase 10 points    Immunizations Immunization History  Administered Date(s) Administered  . Fluad Quad(high Dose 65+) 09/04/2020  .  Influenza, High Dose Seasonal PF 10/01/2018  . PFIZER(Purple Top)SARS-COV-2 Vaccination 02/22/2020, 03/17/2020  . PNEUMOCOCCAL CONJUGATE-20 03/05/2021  . Tdap 10/28/2015    TDAP status: Up to date  Flu Vaccine status: Up to date  Pneumococcal vaccine status: Due, Education has been provided regarding the importance of this vaccine. Advised may receive this vaccine at local pharmacy or Health Dept. Aware to provide a copy of the vaccination record if obtained from local pharmacy or Health Dept. Verbalized acceptance and understanding.  Covid-19 vaccine status: Completed vaccines  Qualifies for Shingles Vaccine? Yes   Zostavax completed No   Shingrix Completed?: No.    Education has been provided regarding the importance of this vaccine. Patient has been advised to call insurance company to determine out of pocket expense if they have not yet received this vaccine. Advised may also receive vaccine at local pharmacy or Health Dept. Verbalized acceptance and understanding.  Screening Tests Health Maintenance  Topic Date Due  . PNA vac Low Risk Adult (1 of 2 - PCV13) 03/17/2001  . COVID-19 Vaccine (3 - Booster for Pfizer series) 08/17/2020  . INFLUENZA VACCINE  07/12/2021  . TETANUS/TDAP  10/27/2025  . HPV VACCINES  Aged Out    Health Maintenance  Health Maintenance Due  Topic Date Due  . PNA vac Low Risk Adult (1 of 2 - PCV13) 03/17/2001  . COVID-19 Vaccine (3 - Booster for Pfizer series) 08/17/2020    Colorectal cancer screening: Type of screening: Cologuard. Completed 04/07/21. Repeat every Pt refered to GI  years   Additional Screening:   Vision Screening: Recommended annual ophthalmology exams for early detection of glaucoma and other disorders of the eye. Is the patient up to date with their annual eye exam?  No  Who is the provider or what is the name of the office in which the patient attends annual eye exams? None at this time  If pt is not established with a provider,  would they like to be referred to a provider to establish care? No .   Dental Screening: Recommended annual dental exams for proper oral hygiene  Community Resource Referral / Chronic Care Management: CRR required  this visit?  No   CCM required this visit?  No      Plan:     I have personally reviewed and noted the following in the patient's chart:   . Medical and social history . Use of alcohol, tobacco or illicit drugs  . Current medications and supplements including opioid prescriptions. Patient is currently taking opioid prescriptions. Information provided to patient regarding non-opioid alternatives. Patient advised to discuss non-opioid treatment plan with their provider. . Functional ability and status . Nutritional status . Physical activity . Advanced directives . List of other physicians . Hospitalizations, surgeries, and ER visits in previous 12 months . Vitals . Screenings to include cognitive, depression, and falls . Referrals and appointments  In addition, I have reviewed and discussed with patient certain preventive protocols, quality metrics, and best practice recommendations. A written personalized care plan for preventive services as well as general preventive health recommendations were provided to patient.     Marzella Schlein, LPN   0/17/5102   Nurse Notes: Pt is requesting refill on Hydrocodone acetaminophen 5/325 take 1 by mouth every 6 hours as needed for moderate pain. Pt also stated that he has NO known Allergies to any medications, however his chart reads his allergies as his wife's allergies please advise

## 2021-04-27 NOTE — Patient Instructions (Signed)
Jonathon Martin , Thank you for taking time to come for your Medicare Wellness Visit. I appreciate your ongoing commitment to your health goals. Please review the following plan we discussed and let me know if I can assist you in the future.   Screening recommendations/referrals: Colonoscopy: Cologuard completed 04/07/21 pt referred to GI Recommended yearly ophthalmology/optometry visit for glaucoma screening and checkup Recommended yearly dental visit for hygiene and checkup  Vaccinations: Influenza vaccine: Up to date Pneumococcal vaccine: Due and discussed Tdap vaccine: Up to date Shingles vaccine: Shingrix discussed. Please contact your pharmacy for coverage information.    Covid-19: Completed 3/13 & 03/17/20 will all office with Booster dates  Advanced directives: Please bring a copy of your health care power of attorney and living will to the office at your convenience.  Conditions/risks identified: Maintain healthy   Next appointment: Follow up in one year for your annual wellness visit.   Preventive Care 11 Years and Older, Male Preventive care refers to lifestyle choices and visits with your health care provider that can promote health and wellness. What does preventive care include?  A yearly physical exam. This is also called an annual well check.  Dental exams once or twice a year.  Routine eye exams. Ask your health care provider how often you should have your eyes checked.  Personal lifestyle choices, including:  Daily care of your teeth and gums.  Regular physical activity.  Eating a healthy diet.  Avoiding tobacco and drug use.  Limiting alcohol use.  Practicing safe sex.  Taking low doses of aspirin every day.  Taking vitamin and mineral supplements as recommended by your health care provider. What happens during an annual well check? The services and screenings done by your health care provider during your annual well check will depend on your age, overall  health, lifestyle risk factors, and family history of disease. Counseling  Your health care provider may ask you questions about your:  Alcohol use.  Tobacco use.  Drug use.  Emotional well-being.  Home and relationship well-being.  Sexual activity.  Eating habits.  History of falls.  Memory and ability to understand (cognition).  Work and work Astronomer. Screening  You may have the following tests or measurements:  Height, weight, and BMI.  Blood pressure.  Lipid and cholesterol levels. These may be checked every 5 years, or more frequently if you are over 67 years old.  Skin check.  Lung cancer screening. You may have this screening every year starting at age 62 if you have a 30-pack-year history of smoking and currently smoke or have quit within the past 15 years.  Fecal occult blood test (FOBT) of the stool. You may have this test every year starting at age 76.  Flexible sigmoidoscopy or colonoscopy. You may have a sigmoidoscopy every 5 years or a colonoscopy every 10 years starting at age 52.  Prostate cancer screening. Recommendations will vary depending on your family history and other risks.  Hepatitis C blood test.  Hepatitis B blood test.  Sexually transmitted disease (STD) testing.  Diabetes screening. This is done by checking your blood sugar (glucose) after you have not eaten for a while (fasting). You may have this done every 1-3 years.  Abdominal aortic aneurysm (AAA) screening. You may need this if you are a current or former smoker.  Osteoporosis. You may be screened starting at age 63 if you are at high risk. Talk with your health care provider about your test results, treatment options,  and if necessary, the need for more tests. Vaccines  Your health care provider may recommend certain vaccines, such as:  Influenza vaccine. This is recommended every year.  Tetanus, diphtheria, and acellular pertussis (Tdap, Td) vaccine. You may need a Td  booster every 10 years.  Zoster vaccine. You may need this after age 66.  Pneumococcal 13-valent conjugate (PCV13) vaccine. One dose is recommended after age 55.  Pneumococcal polysaccharide (PPSV23) vaccine. One dose is recommended after age 13. Talk to your health care provider about which screenings and vaccines you need and how often you need them. This information is not intended to replace advice given to you by your health care provider. Make sure you discuss any questions you have with your health care provider. Document Released: 12/25/2015 Document Revised: 08/17/2016 Document Reviewed: 09/29/2015 Elsevier Interactive Patient Education  2017 [place] Prevention in the Home Falls can cause injuries. They can happen to people of all ages. There are many things you can do to make your home safe and to help prevent falls. What can I do on the outside of my home?  Regularly fix the edges of walkways and driveways and fix any cracks.  Remove anything that might make you trip as you walk through a door, such as a raised step or threshold.  Trim any bushes or trees on the path to your home.  Use bright outdoor lighting.  Clear any walking paths of anything that might make someone trip, such as rocks or tools.  Regularly check to see if handrails are loose or broken. Make sure that both sides of any steps have handrails.  Any raised decks and porches should have guardrails on the edges.  Have any leaves, snow, or ice cleared regularly.  Use sand or salt on walking paths during winter.  Clean up any spills in your garage right away. This includes oil or grease spills. What can I do in the bathroom?  Use night lights.  Install grab bars by the toilet and in the tub and shower. Do not use towel bars as grab bars.  Use non-skid mats or decals in the tub or shower.  If you need to sit down in the shower, use a plastic, non-slip stool.  Keep the floor dry. Clean up  any water that spills on the floor as soon as it happens.  Remove soap buildup in the tub or shower regularly.  Attach bath mats securely with double-sided non-slip rug tape.  Do not have throw rugs and other things on the floor that can make you trip. What can I do in the bedroom?  Use night lights.  Make sure that you have a light by your bed that is easy to reach.  Do not use any sheets or blankets that are too big for your bed. They should not hang down onto the floor.  Have a firm chair that has side arms. You can use this for support while you get dressed.  Do not have throw rugs and other things on the floor that can make you trip. What can I do in the kitchen?  Clean up any spills right away.  Avoid walking on wet floors.  Keep items that you use a lot in easy-to-reach places.  If you need to reach something above you, use a strong step stool that has a grab bar.  Keep electrical cords out of the way.  Do not use floor polish or wax that makes floors slippery. If  you must use wax, use non-skid floor wax.  Do not have throw rugs and other things on the floor that can make you trip. What can I do with my stairs?  Do not leave any items on the stairs.  Make sure that there are handrails on both sides of the stairs and use them. Fix handrails that are broken or loose. Make sure that handrails are as long as the stairways.  Check any carpeting to make sure that it is firmly attached to the stairs. Fix any carpet that is loose or worn.  Avoid having throw rugs at the top or bottom of the stairs. If you do have throw rugs, attach them to the floor with carpet tape.  Make sure that you have a light switch at the top of the stairs and the bottom of the stairs. If you do not have them, ask someone to add them for you. What else can I do to help prevent falls?  Wear shoes that:  Do not have high heels.  Have rubber bottoms.  Are comfortable and fit you well.  Are  closed at the toe. Do not wear sandals.  If you use a stepladder:  Make sure that it is fully opened. Do not climb a closed stepladder.  Make sure that both sides of the stepladder are locked into place.  Ask someone to hold it for you, if possible.  Clearly mark and make sure that you can see:  Any grab bars or handrails.  First and last steps.  Where the edge of each step is.  Use tools that help you move around (mobility aids) if they are needed. These include:  Canes.  Walkers.  Scooters.  Crutches.  Turn on the lights when you go into a dark area. Replace any light bulbs as soon as they burn out.  Set up your furniture so you have a clear path. Avoid moving your furniture around.  If any of your floors are uneven, fix them.  If there are any pets around you, be aware of where they are.  Review your medicines with your doctor. Some medicines can make you feel dizzy. This can increase your chance of falling. Ask your doctor what other things that you can do to help prevent falls. This information is not intended to replace advice given to you by your health care provider. Make sure you discuss any questions you have with your health care provider. Document Released: 09/24/2009 Document Revised: 05/05/2016 Document Reviewed: 01/02/2015 Elsevier Interactive Patient Education  2017 [employer].

## 2021-04-29 ENCOUNTER — Other Ambulatory Visit: Payer: Self-pay | Admitting: Family Medicine

## 2021-04-29 ENCOUNTER — Telehealth: Payer: Self-pay | Admitting: Family Medicine

## 2021-04-29 DIAGNOSIS — B0223 Postherpetic polyneuropathy: Secondary | ICD-10-CM

## 2021-04-29 DIAGNOSIS — M545 Low back pain, unspecified: Secondary | ICD-10-CM

## 2021-04-29 MED ORDER — HYDROCODONE-ACETAMINOPHEN 5-325 MG PO TABS: 1.0000 | ORAL_TABLET | Freq: Four times a day (QID) | ORAL | 0 refills | Status: DC | PRN

## 2021-04-29 NOTE — Telephone Encounter (Signed)
HYDROcodone-acetaminophen (NORCO/VICODIN) 5-325 MG tablet  [id number] - [place], [place] - [address] Phone:  [phone]  Fax:  [phone]     

## 2021-05-05 ENCOUNTER — Encounter: Payer: Self-pay | Admitting: Gastroenterology

## 2021-05-28 ENCOUNTER — Ambulatory Visit: Payer: Medicare (Managed Care) | Admitting: Family Medicine

## 2021-06-10 ENCOUNTER — Other Ambulatory Visit: Payer: Self-pay | Admitting: Family Medicine

## 2021-06-10 DIAGNOSIS — M545 Low back pain, unspecified: Secondary | ICD-10-CM

## 2021-06-10 DIAGNOSIS — B0223 Postherpetic polyneuropathy: Secondary | ICD-10-CM

## 2021-06-10 NOTE — Telephone Encounter (Signed)
Pt is calling in needing a refill on Rx hydrocodone-acetaminophen (NORCO/VICODIN) 5-325 MG #100.  Pharm:  [facility] on [address], [place].

## 2021-06-11 NOTE — Telephone Encounter (Signed)
Last office visit 03/05/21 Last refill 04/29/21

## 2021-06-14 MED ORDER — HYDROCODONE-ACETAMINOPHEN 5-325 MG PO TABS: 1.0000 | ORAL_TABLET | Freq: Four times a day (QID) | ORAL | 0 refills | Status: DC | PRN

## 2021-06-14 NOTE — Telephone Encounter (Signed)
Refilled #30 in primary provider's absence.

## 2021-06-15 NOTE — Telephone Encounter (Signed)
Spoke with the pts wife and informed her of the message below.  Message sent to PCP per her request for additional pills.

## 2021-06-16 ENCOUNTER — Other Ambulatory Visit: Payer: Self-pay | Admitting: Family Medicine

## 2021-06-16 DIAGNOSIS — B0223 Postherpetic polyneuropathy: Secondary | ICD-10-CM

## 2021-06-16 DIAGNOSIS — G8929 Other chronic pain: Secondary | ICD-10-CM

## 2021-06-16 MED ORDER — HYDROCODONE-ACETAMINOPHEN 5-325 MG PO TABS: 1.0000 | ORAL_TABLET | Freq: Four times a day (QID) | ORAL | 0 refills | Status: DC | PRN

## 2021-06-16 NOTE — Telephone Encounter (Signed)
I sent in 100tablets and asked pharmacy to cancel Dr. Lucie Leather 30 tablet rx. Didn't look like he filled it yet.

## 2021-06-23 ENCOUNTER — Other Ambulatory Visit: Payer: Self-pay

## 2021-06-23 ENCOUNTER — Encounter: Payer: Self-pay | Admitting: Family Medicine

## 2021-06-23 ENCOUNTER — Ambulatory Visit (INDEPENDENT_AMBULATORY_CARE_PROVIDER_SITE_OTHER): Payer: Medicare Other | Admitting: Family Medicine

## 2021-06-23 VITALS — BP 140/80 | HR 66 | Temp 97.8°F | Ht 68.0 in | Wt 162.2 lb

## 2021-06-23 DIAGNOSIS — I10 Essential (primary) hypertension: Secondary | ICD-10-CM

## 2021-06-23 DIAGNOSIS — M545 Low back pain, unspecified: Secondary | ICD-10-CM

## 2021-06-23 DIAGNOSIS — E785 Hyperlipidemia, unspecified: Secondary | ICD-10-CM | POA: Diagnosis not present

## 2021-06-23 DIAGNOSIS — M21372 Foot drop, left foot: Secondary | ICD-10-CM | POA: Diagnosis not present

## 2021-06-23 DIAGNOSIS — G8929 Other chronic pain: Secondary | ICD-10-CM | POA: Diagnosis not present

## 2021-06-23 DIAGNOSIS — D649 Anemia, unspecified: Secondary | ICD-10-CM | POA: Diagnosis not present

## 2021-06-23 LAB — CBC WITH DIFFERENTIAL/PLATELET
Basophils Absolute: 0 10*3/uL (ref 0.0–0.1)
Basophils Relative: 0.6 % (ref 0.0–3.0)
Eosinophils Absolute: 0.3 10*3/uL (ref 0.0–0.7)
Eosinophils Relative: 4.8 % (ref 0.0–5.0)
HCT: 37.9 % — ABNORMAL LOW (ref 39.0–52.0)
Hemoglobin: 12.8 g/dL — ABNORMAL LOW (ref 13.0–17.0)
Lymphocytes Relative: 20.2 % (ref 12.0–46.0)
Lymphs Abs: 1.3 10*3/uL (ref 0.7–4.0)
MCHC: 33.9 g/dL (ref 30.0–36.0)
MCV: 94.5 fl (ref 78.0–100.0)
Monocytes Absolute: 0.5 10*3/uL (ref 0.1–1.0)
Monocytes Relative: 7.7 % (ref 3.0–12.0)
Neutro Abs: 4.3 10*3/uL (ref 1.4–7.7)
Neutrophils Relative %: 66.7 % (ref 43.0–77.0)
Platelets: 183 10*3/uL (ref 150.0–400.0)
RBC: 4.01 Mil/uL — ABNORMAL LOW (ref 4.22–5.81)
RDW: 13 % (ref 11.5–15.5)
WBC: 6.5 10*3/uL (ref 4.0–10.5)

## 2021-06-23 LAB — IBC + FERRITIN
Ferritin: 131.9 ng/mL (ref 22.0–322.0)
Iron: 110 ug/dL (ref 42–165)
Saturation Ratios: 34.8 % (ref 20.0–50.0)
Transferrin: 226 mg/dL (ref 212.0–360.0)

## 2021-06-23 LAB — FOLATE: Folate: >24.4 ng/mL (ref >5.9)

## 2021-06-23 LAB — VITAMIN B12: Vitamin B-12: 484 pg/mL (ref 211–911)

## 2021-06-23 MED ORDER — SHINGRIX 50 MCG/0.5ML IM SUSR: 0.5000 mL | Freq: Once | INTRAMUSCULAR | 0 refills | Status: AC

## 2021-06-23 NOTE — Addendum Note (Signed)
Addended by: Andranik Jeune X on: 06/23/2021 11:34 AM   Modules accepted: Orders  

## 2021-06-23 NOTE — Patient Instructions (Addendum)
*  complete that third covid injection (and 4th to follow)  *shingrix vaccination prescription given if you want to complete this as well.   *if you want; call jeffrey sneider [phone] to see if you got shingrix (or different) shingles vaccine

## 2021-06-23 NOTE — Progress Notes (Addendum)
Benito Lemmerman Dunk DOB: 09-15-1934 Encounter date: 06/23/2021  This is a 87 y.o. male who presents with Chief Complaint  Patient presents with   Follow-up     History of present illness: Last visit with me was 03/05/2021.  Got foot insert from Dr. Katrinka Blazing and needs to follow up again  Chronic back pain: hurting all the time. Does take the pain medication daily. Pain is severe at 10/10 without medication; with medication brings down to 3/10. Most pain in back. Not feeling weak. Takes little walks (around [place] in kitchen) and always does use walker. Balance better with walker. all of this is the same since last visit.    HTN: lisinopril 20mg . Not checking at home. Good about taking medication. daughter states that bp stays a little higher because he likes his salt.    Memory impairment: aricept 10mg  at bedtime (med started by previous provider although patient states he has no concerns with memory). Still taking this; no concerns with memory.     HL: lipitor 20mg  daily; no muscle aches/cramps.    Insomnia: remeron; sleep has been good. Still doing well; mood doing well.   Shingles postherpetic neuralgia: still having pain with this - every once in awhile. Hasn't had shingrix vaccination. Feels like this is getting better overall.     No Known Allergies Current Meds  Medication Sig   AMBULATORY NON FORMULARY MEDICATION Rollator   AMBULATORY NON FORMULARY MEDICATION Left AFO   aspirin EC 81 MG tablet Take 81 mg by mouth daily. Swallow whole.   atorvastatin (LIPITOR) 20 MG tablet Take 1 tablet by mouth once daily   bisacodyl (DULCOLAX) 5 MG EC tablet Take 5 mg by mouth daily as needed for moderate constipation. Tues and fri to stay regular   Calcium Polycarbophil (FIBER-CAPS PO) Take by mouth.   donepezil (ARICEPT) 10 MG tablet TAKE 1 TABLET BY MOUTH AT BEDTIME   gabapentin (NEURONTIN) 100 MG capsule TAKE 3 CAPSULES BY MOUTH TWICE DAILY   HYDROcodone-acetaminophen (NORCO/VICODIN) 5-325  MG tablet Take 1 tablet by mouth every 6 (six) hours as needed for moderate pain or severe pain.   lisinopril (ZESTRIL) 20 MG tablet TAKE 1 TABLET BY MOUTH ONCE DAILY . APPOINTMENT REQUIRED FOR FUTURE REFILLS   mirtazapine (REMERON) 15 MG tablet TAKE 1 TABLET BY MOUTH ONCE DAILY AT BEDTIME   Multiple Vitamin (MULTIVITAMIN) capsule Take 1 capsule by mouth daily.   Zoster Vaccine Adjuvanted [facility]) injection Inject 0.5 mLs into the muscle once for 1 dose. Repeat in 2-6 months    Review of Systems  Constitutional:  Negative for chills, fatigue and fever.  Respiratory:  Negative for cough, chest tightness, shortness of breath and wheezing.   Cardiovascular:  Negative for chest pain, palpitations and leg swelling.   Objective:  BP 140/80 (BP Location: Left Arm, Patient Position: Sitting, Cuff Size: Normal)   Pulse 66   Temp 97.8 F (36.6 C) (Oral)   Ht 5\' 8"  (1.727 m)   Wt 162 lb 3.2 oz (73.6 kg)   SpO2 96%   BMI 24.66 kg/m   Weight: 162 lb 3.2 oz (73.6 kg)   BP Readings from Last 3 Encounters:  06/23/21 140/80  04/02/21 (!) 140/92  03/05/21 140/90   Wt Readings from Last 3 Encounters:  06/23/21 162 lb 3.2 oz (73.6 kg)  04/02/21 161 lb (73 kg)  09/04/20 155 lb 1.6 oz (70.4 kg)    Physical Exam Constitutional:      General: He is not in  acute distress.    Appearance: He is well-developed.  Cardiovascular:     Rate and Rhythm: Normal rate and regular rhythm.     Heart sounds: Normal heart sounds. No murmur heard.   No friction rub.  Pulmonary:     Effort: Pulmonary effort is normal. No respiratory distress.     Breath sounds: Normal breath sounds. No wheezing or rales.  Musculoskeletal:     Right lower leg: No edema.     Left lower leg: No edema.  Neurological:     Mental Status: He is alert and oriented to person, place, and time.  Psychiatric:        Behavior: Behavior normal.    Assessment/Plan  1. Hypertension, unspecified type Elevated on my recheck to  170/80. Encouraged to check bp at home and let us know if regularly over 140 systolic.  - CBC with Differential/Platelet; Future  2. Chronic midline low back pain without sciatica Stable. Continue with pain medication as he has been taking. No side effects or constipation with thi.   3. Hyperlipidemia, unspecified hyperlipidemia type Continue with lipitor 20 daily.  4. Left foot drop Following with Dr. Katrinka Blazing; doing well with inserts right now.   5. Anemia, unspecified type - Vitamin B12; Future - Iron, TIBC and Ferritin Panel; Future - Folate; Future   Return in about 3 months (around 09/23/2021) for Chronic condition visit.  we discussed advanced directives/living will in visit. Given papers on this for himself and wife. Discussed with son in law. Reviewed some of details re events during cardiac resuscitation and options for choices of care including feeding tubes, intubation, etc.     Theodis Shove, MD

## 2021-06-23 NOTE — Addendum Note (Signed)
Addended by: Kandra Nicolas on: 06/23/2021 11:35 AM   Modules accepted: Orders

## 2021-06-23 NOTE — Addendum Note (Signed)
Addended by: Kandra Nicolas on: 06/23/2021 11:34 AM   Modules accepted: Orders

## 2021-07-13 ENCOUNTER — Telehealth: Payer: Self-pay

## 2021-07-13 ENCOUNTER — Other Ambulatory Visit: Payer: Self-pay | Admitting: Family Medicine

## 2021-07-13 DIAGNOSIS — B029 Zoster without complications: Secondary | ICD-10-CM

## 2021-07-13 NOTE — Telephone Encounter (Signed)
Pts wife is calling to have to Rx refill sent to pharmacy pt is getting low on HYDROcodone-acetaminophen (NORCO/VICODIN) 5-325 MG tablet

## 2021-07-14 ENCOUNTER — Other Ambulatory Visit: Payer: Self-pay | Admitting: Family Medicine

## 2021-07-14 DIAGNOSIS — B0223 Postherpetic polyneuropathy: Secondary | ICD-10-CM

## 2021-07-14 DIAGNOSIS — G8929 Other chronic pain: Secondary | ICD-10-CM

## 2021-07-14 DIAGNOSIS — M545 Low back pain, unspecified: Secondary | ICD-10-CM

## 2021-07-14 MED ORDER — HYDROCODONE-ACETAMINOPHEN 5-325 MG PO TABS: 1.0000 | ORAL_TABLET | Freq: Four times a day (QID) | ORAL | 0 refills | Status: DC | PRN

## 2021-07-14 NOTE — Telephone Encounter (Signed)
done

## 2021-08-10 ENCOUNTER — Encounter: Payer: Self-pay | Admitting: Gastroenterology

## 2021-08-10 ENCOUNTER — Ambulatory Visit: Payer: Medicare Other | Admitting: Gastroenterology

## 2021-08-10 VITALS — BP 142/98 | HR 81 | Ht 68.0 in | Wt 153.0 lb

## 2021-08-10 DIAGNOSIS — K921 Melena: Secondary | ICD-10-CM

## 2021-08-10 NOTE — Progress Notes (Addendum)
Referring Provider: Wynn Banker, MD Primary Care Physician:  Wynn Banker, MD  Reason for Consultation:  Anemia, FOBT+ stool   IMPRESSION:  Normocytic anemia with normal iron and ferritin levels FOBT + stool Recent reflux and eructation - improved with Nexium OTC History of bleeding peptic ulcer disease Recent black stool x 1   Discussed strategies to evaluate for GI blood loss anemia.  Discussed options of (1) no endoscopy, supportive care and monitoring, (2) EGD given his recent upper symptoms with reconsideration for colonoscopy if EGD is non-diagnostic, (3) EGD and colonoscopy, and (4) Colonoscopy with consideration of EGD if colonoscopy in non-diagnostic. [name] is not eager to pursue any endoscopic evaluation as he feels he is asymptomatic. His daughter would like to consider EGD +/- colonoscopy if endoscopy is non-diagnostic. She will further discuss strategies with him and contact me if they decided to proceed with scheduling in the future.    PLAN: Continue Nexium 40 mg QAM Avoid NSAIDs as able EGD +/- colonoscopy  I spent over 45 minutes, including in depth chart review, independent review of results, communicating results with the patient directly, face-to-face time with the patient, coordinating care, ordering studies and medications as appropriate, and documentation.     HPI: Jonathon Martin is a 87 y.o. male referred by Dr. Hassan Rowan for further evaluation of anemia and Hemoccult positive stools.  The history is obtained through the patient, his son-in-law who accompanies him to this appointment, and review of his electronic health record.  He has a history of hypertension, chronic back pain, memory impairment, hyperlipidemia, insomnia, postherpetic neuralgia. In the distal past, he had a bleeding ulcer. I also spoke with his daughter by phone, [phone], during this visit.   Found to have a positive IFOB during the evaluation of an abnormal  CBC.  Labs 2016: hemoglobin 13.3 Labs 2018: hemoglobin 12.9 Labs 2019: hemoglobin 13 Labs 03/18/21: fecal occult blood positive stool Labs 06/23/2021: Iron 110, ferritin 131, hemoglobin 12.8, MCV 94.5, RDW 13, platelets 183  No overt GI blood loss except for one isolated episode of black stools. Otherwise, no melena, hematochezia, bright red blood per rectum. No epistaxis, vaginal bleeding, hemoptysis, or hematuria. No identified exacerbating or relieving features.   Recently experiencing reflux, eructation, and dull RUQ abdominal pain. No dysphagia, odynophagia, dysphonia, nausea, vomiting, neck pain, or sore throat. Weight is up. Symptoms have improved since he started taking Nexium OTC.   He has chronic constipation that he treats with Dulcolax. Some abdominal pain as the constipation worsens.   No prior colonoscopy or colon cancer screening. He is resistant to any endoscopy and particularly colonoscopy at this time. H pylori status is unknown.   Past Medical History:  Diagnosis Date   Chronic back pain    Chronic leg pain     Past Surgical History:  Procedure Laterality Date   leg  1962   right leg reconstruction    Prior to Admission medications   Medication Sig Start Date End Date Taking? Authorizing Provider  AMBULATORY NON FORMULARY MEDICATION Rollator 04/02/21   Antoine Primas M, DO  AMBULATORY NON FORMULARY MEDICATION Left AFO 04/02/21   Judi Saa, DO  aspirin EC 81 MG tablet Take 81 mg by mouth daily. Swallow whole.    [provider]  atorvastatin (LIPITOR) 20 MG tablet Take 1 tablet by mouth once daily 07/13/21   Koberlein, Junell C, MD  bisacodyl (DULCOLAX) 5 MG EC tablet Take 5 mg by mouth daily  as needed for moderate constipation. Tues and fri to stay regular    [provider]  Calcium Polycarbophil (FIBER-CAPS PO) Take by mouth.    [provider]  donepezil (ARICEPT) 10 MG tablet TAKE 1 TABLET BY MOUTH AT BEDTIME 07/13/21   Koberlein,  Junell C, MD  gabapentin (NEURONTIN) 100 MG capsule TAKE 3 CAPSULES BY MOUTH TWICE DAILY 07/13/21   Koberlein, Paris Lore, MD  HYDROcodone-acetaminophen (NORCO/VICODIN) 5-325 MG tablet Take 1 tablet by mouth every 6 (six) hours as needed for moderate pain or severe pain. 07/14/21   Koberlein, Jannette Spanner C, MD  lisinopril (ZESTRIL) 20 MG tablet TAKE 1 TABLET BY MOUTH ONCE DAILY . APPOINTMENT REQUIRED FOR FUTURE REFILLS 07/13/21   Wynn Banker, MD  mirtazapine (REMERON) 15 MG tablet TAKE 1 TABLET BY MOUTH ONCE DAILY AT BEDTIME 07/13/21   Koberlein, Paris Lore, MD  Multiple Vitamin (MULTIVITAMIN) capsule Take 1 capsule by mouth daily.    [provider]    Current Outpatient Medications  Medication Sig Dispense Refill   AMBULATORY NON FORMULARY MEDICATION Rollator 1 Units 0   AMBULATORY NON FORMULARY MEDICATION Left AFO 1 Units 0   aspirin EC 81 MG tablet Take 81 mg by mouth daily. Swallow whole.     atorvastatin (LIPITOR) 20 MG tablet Take 1 tablet by mouth once daily 90 tablet 0   bisacodyl (DULCOLAX) 5 MG EC tablet Take 5 mg by mouth daily as needed for moderate constipation. Tues and fri to stay regular     Calcium Polycarbophil (FIBER-CAPS PO) Take by mouth.     donepezil (ARICEPT) 10 MG tablet TAKE 1 TABLET BY MOUTH AT BEDTIME 90 tablet 0   esomeprazole (NEXIUM) 20 MG packet Take 20 mg by mouth daily before breakfast.     gabapentin (NEURONTIN) 100 MG capsule TAKE 3 CAPSULES BY MOUTH TWICE DAILY 540 capsule 0   HYDROcodone-acetaminophen (NORCO/VICODIN) 5-325 MG tablet Take 1 tablet by mouth every 6 (six) hours as needed for moderate pain or severe pain. 100 tablet 0   lisinopril (ZESTRIL) 20 MG tablet TAKE 1 TABLET BY MOUTH ONCE DAILY . APPOINTMENT REQUIRED FOR FUTURE REFILLS (Patient taking differently: Take 20 mg by mouth daily.) 90 tablet 0   mirtazapine (REMERON) 15 MG tablet TAKE 1 TABLET BY MOUTH ONCE DAILY AT BEDTIME 90 tablet 0   Multiple Vitamin (MULTIVITAMIN) capsule Take 1 capsule  by mouth daily.     No current facility-administered medications for this visit.    Allergies as of 08/10/2021   (No Known Allergies)    Family History  Problem Relation Age of Onset   Stroke Mother 40   Epilepsy Sister    Colon cancer Neg Hx    Stomach cancer Neg Hx    Pancreatic cancer Neg Hx    Esophageal cancer Neg Hx    Liver disease Neg Hx        Review of Systems: 12 system ROS is negative except as noted above with the addition of arthritis, back pain, cough, and fatigue.   Physical Exam: General:   Alert,  well-nourished, pleasant and cooperative in NAD Head:  Normocephalic and atraumatic. Eyes:  Sclera clear, no icterus.   Conjunctiva pink. Ears:  Normal auditory acuity. Nose:  No deformity, discharge,  or lesions. Mouth:  No deformity or lesions.   Neck:  Supple; no masses or thyromegaly. Lungs:  Clear throughout to auscultation.   No wheezes. Heart:  Regular rate and rhythm; no murmurs. Abdomen:  Soft, nontender,  nondistended, normal bowel sounds, no rebound or guarding. No hepatosplenomegaly.   Rectal:  Deferred  Msk:  Symmetrical. No boney deformities LAD: No inguinal or umbilical LAD Extremities:  No clubbing or edema. Neurologic:  Alert and  oriented x4;  grossly nonfocal Skin:  Intact without significant lesions or rashes. Psych:  Alert and cooperative. Normal mood and affect.    Greydon Betke L. Orvan Falconer, MD, MPH 08/10/2021, 2:21 PM

## 2021-08-10 NOTE — Patient Instructions (Addendum)
It was my pleasure to provide care to you today. Based on our discussion, I am providing you with my recommendations below:  RECOMMENDATION(S):   PROCEDURE:  I am recommending that you have a(n) ENDOSCOPY and if negative, possibly a COLONOSCOPY completed. Per your and your family's request, my staff did not schedule the procedure(s) today. When you and/or your family are ready to proceed with scheduling, please contact my office at [phone].   NOTE:   At the time of scheduling your procedure, you will also be scheduled for a pre-visit with my nurse. During this appointment, you will be provided with your prep instructions.   FOLLOW UP:  I would like for you to follow up with me in as needed. Please call the office at [phone] to schedule your appointment.  BMI:  If you are age 68 or older, your body mass index should be between 23-30. Your Body mass index is 23.26 kg/m. If this is out of the aforementioned range listed, please consider follow up with your Primary Care Provider.  MY CHART:  The [facility] providers would like to encourage you to use [facility] to communicate with providers for non-urgent requests or questions.  Due to long hold times on the telephone, sending your provider a message by [facility] may be a faster and more efficient way to get a response.  Please allow 48 business hours for a response.  Please remember that this is for non-urgent requests.   Thank you for trusting me with your gastrointestinal care!    Tressia Danas, MD, MPH

## 2021-09-09 ENCOUNTER — Telehealth: Payer: Self-pay | Admitting: Family Medicine

## 2021-09-09 NOTE — Telephone Encounter (Signed)
PT spouse called to request a refill of the PTs HYDROcodone-acetaminophen (NORCO/VICODIN) 5-325 MG tablet. She states that he only has enough left for this week and then will be out. Please send to the Pharmacy on file.

## 2021-09-10 ENCOUNTER — Other Ambulatory Visit: Payer: Self-pay | Admitting: Family Medicine

## 2021-09-10 DIAGNOSIS — B0223 Postherpetic polyneuropathy: Secondary | ICD-10-CM

## 2021-09-10 DIAGNOSIS — G8929 Other chronic pain: Secondary | ICD-10-CM

## 2021-09-10 MED ORDER — HYDROCODONE-ACETAMINOPHEN 5-325 MG PO TABS: 1.0000 | ORAL_TABLET | Freq: Four times a day (QID) | ORAL | 0 refills | Status: DC | PRN

## 2021-09-14 ENCOUNTER — Telehealth: Payer: Self-pay | Admitting: Gastroenterology

## 2021-09-14 NOTE — Telephone Encounter (Unsigned)
Inbound call from [facility] Antrum trying to schedule an EGD for this pt. Pt had an appt in august with Dr. Orvan Falconer. Best contact is [phone]. Thank you.

## 2021-09-16 NOTE — Telephone Encounter (Signed)
Returned dtr's call. LVM requesting returned call.

## 2021-09-20 ENCOUNTER — Encounter: Payer: Self-pay | Admitting: Gastroenterology

## 2021-09-20 ENCOUNTER — Other Ambulatory Visit: Payer: Self-pay

## 2021-09-20 DIAGNOSIS — D649 Anemia, unspecified: Secondary | ICD-10-CM

## 2021-09-20 NOTE — Telephone Encounter (Signed)
SECOND ATTEMPT: ° °LVM requesting returned call. °

## 2021-09-20 NOTE — Telephone Encounter (Signed)
Dtr returned call. Scheduled for EGD 09/21/21 @ 10am. Requested prep instructions be sent via My Chart. Amb referral placed. Notified pre auth team and CRNA of add on for tomorrow.

## 2021-09-21 ENCOUNTER — Other Ambulatory Visit: Payer: Self-pay

## 2021-09-21 ENCOUNTER — Other Ambulatory Visit: Payer: Self-pay | Admitting: *Deleted

## 2021-09-21 ENCOUNTER — Other Ambulatory Visit (INDEPENDENT_AMBULATORY_CARE_PROVIDER_SITE_OTHER): Payer: Medicare Other

## 2021-09-21 ENCOUNTER — Encounter: Payer: Self-pay | Admitting: Gastroenterology

## 2021-09-21 ENCOUNTER — Ambulatory Visit (AMBULATORY_SURGERY_CENTER): Payer: Medicare Other | Admitting: Gastroenterology

## 2021-09-21 VITALS — BP 134/75 | HR 74 | Temp 96.8°F | Resp 15 | Ht 68.0 in | Wt 153.0 lb

## 2021-09-21 DIAGNOSIS — E7800 Pure hypercholesterolemia, unspecified: Secondary | ICD-10-CM | POA: Diagnosis not present

## 2021-09-21 DIAGNOSIS — D649 Anemia, unspecified: Secondary | ICD-10-CM | POA: Diagnosis not present

## 2021-09-21 DIAGNOSIS — K2100 Gastro-esophageal reflux disease with esophagitis, without bleeding: Secondary | ICD-10-CM

## 2021-09-21 DIAGNOSIS — K209 Esophagitis, unspecified without bleeding: Secondary | ICD-10-CM

## 2021-09-21 DIAGNOSIS — K2970 Gastritis, unspecified, without bleeding: Secondary | ICD-10-CM

## 2021-09-21 DIAGNOSIS — K319 Disease of stomach and duodenum, unspecified: Secondary | ICD-10-CM

## 2021-09-21 DIAGNOSIS — K921 Melena: Secondary | ICD-10-CM | POA: Diagnosis not present

## 2021-09-21 DIAGNOSIS — K31A0 Gastric intestinal metaplasia, unspecified: Secondary | ICD-10-CM

## 2021-09-21 DIAGNOSIS — K3189 Other diseases of stomach and duodenum: Secondary | ICD-10-CM | POA: Diagnosis not present

## 2021-09-21 DIAGNOSIS — K31A11 Gastric intestinal metaplasia without dysplasia, involving the antrum: Secondary | ICD-10-CM | POA: Diagnosis not present

## 2021-09-21 LAB — CBC
HCT: 39.6 % (ref 39.0–52.0)
Hemoglobin: 13.2 g/dL (ref 13.0–17.0)
MCHC: 33.5 g/dL (ref 30.0–36.0)
MCV: 94.7 fl (ref 78.0–100.0)
Platelets: 208 10*3/uL (ref 150.0–400.0)
RBC: 4.18 Mil/uL — ABNORMAL LOW (ref 4.22–5.81)
RDW: 13 % (ref 11.5–15.5)
WBC: 6.9 10*3/uL (ref 4.0–10.5)

## 2021-09-21 LAB — IBC + FERRITIN
Ferritin: 196.4 ng/mL (ref 22.0–322.0)
Iron: 110 ug/dL (ref 42–165)
Saturation Ratios: 36.7 % (ref 20.0–50.0)
TIBC: 299.6 ug/dL (ref 250.0–450.0)
Transferrin: 214 mg/dL (ref 212.0–360.0)

## 2021-09-21 MED ORDER — SODIUM CHLORIDE 0.9 % IV SOLN: 500.0000 mL | Freq: Once | INTRAVENOUS | Status: DC

## 2021-09-21 MED ORDER — ESOMEPRAZOLE MAGNESIUM 40 MG PO PACK: 40.0000 mg | PACK | Freq: Two times a day (BID) | ORAL | 0 refills | Status: DC

## 2021-09-21 NOTE — Progress Notes (Signed)
Called to room to assist during endoscopic procedure.  Patient ID and intended procedure confirmed with present staff. Received instructions for my participation in the procedure from the performing physician.  

## 2021-09-21 NOTE — Op Note (Signed)
[facility] Patient Name: Jonathon Martin [facility] Procedure Date: 09/21/2021 9:56 AM MRN: [id number] Endoscopist: Tressia Danas MD, MD Age: 87 Referring MD:  Date of Birth: [birth year] Gender: Male Account #: [id number] Procedure:                Upper GI endoscopy Indications:              Normocytic anemiawith normal iron and ferritin                            levels                           FOBT + stool                           Recent reflux and eructation - improved with Nexium                            OTC                           History of bleeding peptic ulcer disease                           Recent black stool x 1 Medicines:                Monitored Anesthesia Care Procedure:                Pre-Anesthesia Assessment:                           - Prior to the procedure, a History and Physical                            was performed, and patient medications and                            allergies were reviewed. The patient's tolerance of                            previous anesthesia was also reviewed. The risks                            and benefits of the procedure and the sedation                            options and risks were discussed with the patient.                            All questions were answered, and informed consent                            was obtained. Prior Anticoagulants: The patient has                            taken no previous anticoagulant or antiplatelet  agents. ASA Grade Assessment: II - A patient with                            mild systemic disease. After reviewing the risks                            and benefits, the patient was deemed in                            satisfactory condition to undergo the procedure.                           After obtaining informed consent, the endoscope was                            passed under direct vision. Throughout the                            procedure, the  patient's blood pressure, pulse, and                            oxygen saturations were monitored continuously. The                            GIF [id number] #[id number] was introduced through the                            mouth, and advanced to the third part of duodenum.                            The upper GI endoscopy was accomplished without                            difficulty. The patient tolerated the procedure                            well. Scope In: Scope Out: Findings:                 LA Grade A (one or more mucosal breaks less than 5                            mm, not extending between tops of 2 mucosal folds)                            esophagitis with no bleeding was found in the                            distal esophagus. The z-line was present 40 cm from                            the incisors. Biopsies were taken with a cold  forceps for histology. Estimated blood loss was                            minimal.                           Patchy moderate inflammation characterized by                            adherent blood, erythema, friability and                            granularity was found in the gastric body and in                            the gastric antrum. Biopsies were taken from the                            antrum, body, and fundus with a cold forceps for                            histology. Estimated blood loss was minimal.                           Patchy mildly erythematous mucosa without active                            bleeding and with no stigmata of bleeding was found                            in the duodenal bulb. Biopsies were taken with a                            cold forceps for histology. Estimated blood loss                            was minimal.                           The exam was otherwise without abnormality. Complications:            No immediate complications. Estimated blood loss:                             Minimal. Estimated Blood Loss:     Estimated blood loss was minimal. Impression:               - LA Grade A reflux esophagitis with no bleeding.                            Biopsied.                           - Gastritis. Biopsied.                           -  Erythematous duodenopathy. Biopsied.                           - The examination was otherwise normal. Recommendation:           - Patient has a contact number available for                            emergencies. The signs and symptoms of potential                            delayed complications were discussed with the                            patient. Return to normal activities tomorrow.                            Written discharge instructions were provided to the                            patient.                           - Resume previous diet.                           - Continue present medications. Increase Nexium to                            40 mg BID.                           - No aspirin, ibuprofen, naproxen, or other                            non-steroidal anti-inflammatory drugs.                           - Stop in the lab this morning to repeat hemoglobin                            and iron studies.                           - Office follow-up in 3-4 weeks, earlier if needed. Tressia Danas MD, MD 09/21/2021 10:26:37 AM This report has been signed electronically.

## 2021-09-21 NOTE — Progress Notes (Signed)
VS taken by SM 

## 2021-09-21 NOTE — Progress Notes (Signed)
1004 Robinul 0.1 mg IV given due large amount of secretions upon assessment.  MD made aware, vss 

## 2021-09-21 NOTE — Progress Notes (Signed)
Report given to PACU, vss 

## 2021-09-21 NOTE — Progress Notes (Signed)
Referring Provider: Wynn Banker, MD Primary Care Physician:  Wynn Banker, MD  Reason for Procedure:  Unexplained anemia   IMPRESSION:  Normocytic anemia with normal iron and ferritin levels FOBT + stool Recent reflux and eructation - improved with Nexium OTC History of bleeding peptic ulcer disease Recent black stool x 1  Appropriate candidate for monitored anesthesia in the [facility]  PLAN: EGD in the [facility] today   HPI: Jonathon Martin is a 87 y.o. male presents for EGD to evaluate anemia, hemoccult positive stools, and recent reflux. Please see my consultation note from 08/10/21 for full details. There has been no significant change in history or physical exam.     Past Medical History:  Diagnosis Date   Chronic back pain    Chronic leg pain     Past Surgical History:  Procedure Laterality Date   leg  1962   right leg reconstruction    Current Outpatient Medications  Medication Sig Dispense Refill   AMBULATORY NON FORMULARY MEDICATION Rollator 1 Units 0   AMBULATORY NON FORMULARY MEDICATION Left AFO 1 Units 0   aspirin EC 81 MG tablet Take 81 mg by mouth daily. Swallow whole.     atorvastatin (LIPITOR) 20 MG tablet Take 1 tablet by mouth once daily 90 tablet 0   bisacodyl (DULCOLAX) 5 MG EC tablet Take 5 mg by mouth daily as needed for moderate constipation. Tues and fri to stay regular     Calcium Polycarbophil (FIBER-CAPS PO) Take by mouth.     donepezil (ARICEPT) 10 MG tablet TAKE 1 TABLET BY MOUTH AT BEDTIME 90 tablet 0   esomeprazole (NEXIUM) 20 MG packet Take 20 mg by mouth daily before breakfast.     gabapentin (NEURONTIN) 100 MG capsule TAKE 3 CAPSULES BY MOUTH TWICE DAILY 540 capsule 0   HYDROcodone-acetaminophen (NORCO/VICODIN) 5-325 MG tablet Take 1 tablet by mouth every 6 (six) hours as needed for moderate pain or severe pain. 100 tablet 0   lisinopril (ZESTRIL) 20 MG tablet TAKE 1 TABLET BY MOUTH ONCE DAILY . APPOINTMENT REQUIRED FOR FUTURE  REFILLS (Patient taking differently: Take 20 mg by mouth daily.) 90 tablet 0   mirtazapine (REMERON) 15 MG tablet TAKE 1 TABLET BY MOUTH ONCE DAILY AT BEDTIME 90 tablet 0   Multiple Vitamin (MULTIVITAMIN) capsule Take 1 capsule by mouth daily.     No current facility-administered medications for this visit.    Allergies as of 09/21/2021   (No Known Allergies)    Family History  Problem Relation Age of Onset   Stroke Mother 2   Epilepsy Sister    Colon cancer Neg Hx    Stomach cancer Neg Hx    Pancreatic cancer Neg Hx    Esophageal cancer Neg Hx    Liver disease Neg Hx      Physical Exam: General:   Alert,  well-nourished, pleasant and cooperative in NAD Head:  Normocephalic and atraumatic. Eyes:  Sclera clear, no icterus.   Conjunctiva pink. Mouth:  No deformity or lesions.   Neck:  Supple; no masses or thyromegaly. Lungs:  Clear throughout to auscultation.   No wheezes. Heart:  Regular rate and rhythm; no murmurs. Abdomen:  Soft, non-tender, nondistended, normal bowel sounds, no rebound or guarding.  Msk:  Symmetrical. No boney deformities LAD: No inguinal or umbilical LAD Extremities:  No clubbing or edema. Neurologic:  Alert and  oriented x4;  grossly nonfocal Skin:  No obvious rash or bruise. Psych:  Alert  and cooperative. Normal mood and affect.     Hadley Soileau L. Orvan Falconer, MD, MPH 09/21/2021, 9:05 AM

## 2021-09-21 NOTE — Patient Instructions (Signed)
Handouts Provided:  Esophagitis and Gastritis  INCREASE Nexium to 40 mg twice daily.  No aspirin, ibuprofen, naproxen, or other non-steroidal anti-inflammatory drugs  YOU HAD AN ENDOSCOPIC PROCEDURE TODAY AT THE [facility]:   Refer to the procedure report that was given to you for any specific questions about what was found during the examination.  If the procedure report does not answer your questions, please call your gastroenterologist to clarify.  If you requested that your care partner not be given the details of your procedure findings, then the procedure report has been included in a sealed envelope for you to review at your convenience later.  YOU SHOULD EXPECT: Some feelings of bloating in the abdomen. Passage of more gas than usual.  Walking can help get rid of the air that was put into your GI tract during the procedure and reduce the bloating. If you had a lower endoscopy (such as a colonoscopy or flexible sigmoidoscopy) you may notice spotting of blood in your stool or on the toilet paper. If you underwent a bowel prep for your procedure, you may not have a normal bowel movement for a few days.  Please Note:  You might notice some irritation and congestion in your nose or some drainage.  This is from the oxygen used during your procedure.  There is no need for concern and it should clear up in a day or so.  SYMPTOMS TO REPORT IMMEDIATELY:  Following upper endoscopy (EGD)  Vomiting of blood or coffee ground material  New chest pain or pain under the shoulder blades  Painful or persistently difficult swallowing  New shortness of breath  Fever of 100F or higher  Black, tarry-looking stools  For urgent or emergent issues, a gastroenterologist can be reached at any hour by calling [phone]. Do not use MyChart messaging for urgent concerns.    DIET:  We do recommend a small meal at first, but then you may proceed to your regular diet.  Drink plenty of fluids but  you should avoid alcoholic beverages for 24 hours.  ACTIVITY:  You should plan to take it easy for the rest of today and you should NOT DRIVE or use heavy machinery until tomorrow (because of the sedation medicines used during the test).    FOLLOW UP: Our staff will call the number listed on your records 48-72 hours following your procedure to check on you and address any questions or concerns that you may have regarding the information given to you following your procedure. If we do not reach you, we will leave a message.  We will attempt to reach you two times.  During this call, we will ask if you have developed any symptoms of COVID 19. If you develop any symptoms (ie: fever, flu-like symptoms, shortness of breath, cough etc.) before then, please call [phone].  If you test positive for Covid 19 in the 2 weeks post procedure, please call and report this information to [place].    If any biopsies were taken you will be contacted by phone or by letter within the next 1-3 weeks.  Please call us at [phone] if you have not heard about the biopsies in 3 weeks.    SIGNATURES/CONFIDENTIALITY: You and/or your care partner have signed paperwork which will be entered into your electronic medical record.  These signatures attest to the fact that that the information above on your After Visit Summary has been reviewed and is understood.  Full responsibility of the confidentiality  of this discharge information lies with you and/or your care-partner.

## 2021-09-22 ENCOUNTER — Telehealth: Payer: Self-pay

## 2021-09-22 NOTE — Telephone Encounter (Signed)
SECOND ATTEMPT:  LVM requesting returned call re: need to schedule f/u appt, as well as to inform about below error/correction.  Received notification from Dr. Orvan Falconer indicating a Rx was sent erroneously for Nexium 40mg  BID. While Dr. did make the recommendation per her procedure report, she intended for pt to purchase OTC. Dr. Orvan Falconer requested medication be d/c'd and for the pt to be notified to purchase.

## 2021-09-22 NOTE — Telephone Encounter (Signed)
This was sent in by Dr. Orvan Falconer, not me.

## 2021-09-22 NOTE — Telephone Encounter (Signed)
Per Dr. Orvan Falconer request, called pt to schedule 3-4 week f/u. LVM requesting returned call.

## 2021-09-22 NOTE — Telephone Encounter (Signed)
Patient's wife called and ask if Rx can be changed from packets to capsules esomeprazole (NEXIUM) 40 MG packet Pt asked if it Rx can be sent to pharmacy this afternoon

## 2021-09-22 NOTE — Telephone Encounter (Signed)
Left a detailed message at the patient's home number to contact Dr Daleen Bo office for refills as below.

## 2021-09-23 ENCOUNTER — Telehealth: Payer: Self-pay | Admitting: *Deleted

## 2021-09-23 MED ORDER — ESOMEPRAZOLE MAGNESIUM 40 MG PO CPDR: 40.0000 mg | DELAYED_RELEASE_CAPSULE | Freq: Two times a day (BID) | ORAL | 11 refills | Status: DC

## 2021-09-23 NOTE — Telephone Encounter (Signed)
Wife of patient called back and was informed to contact Dr. Orvan Falconer office to have Rx changed.

## 2021-09-23 NOTE — Telephone Encounter (Signed)
RX for Nexium 40 mg sent to [facility] on [address].  LM on patient's son in law's voicemail notifying him of this and to call back if he has questions.

## 2021-09-23 NOTE — Telephone Encounter (Signed)
  Follow up Call-  Call back number 09/21/2021  Post procedure Call Back phone  # [phone]  Permission to leave phone message Yes  Some recent data might be hidden     Patient questions:  Do you have a fever, pain , or abdominal swelling? No. Pain Score  0 *  Have you tolerated food without any problems? Yes.    Have you been able to return to your normal activities? Yes.    Do you have any questions about your discharge instructions: Diet   No. Medications  No. Follow up visit  No.  Do you have questions or concerns about your Care? No.  Actions: * If pain score is 4 or above: No action needed, pain <4.  Spoke with pts son in law.  There still seems to be some confusion about meds.  He states that Dr. Orvan Falconer wanted patient to increase Nexium to 40mg  and that would be by prescription.  The phone note from yesterday 10/13 contradicts that.  Dr. 11/13 could you please clarify.  Would you like patient to take Nexium 20 mg or 40 mg?  And should that be OTC?

## 2021-09-23 NOTE — Telephone Encounter (Signed)
Pt returned call but was with a patient at the time of call. Scheduling staff sent secure chat notifying me that pt was on the phone but did not want to hold and that I would need to call back. Called pt and LVM requesting returned call.

## 2021-09-23 NOTE — Telephone Encounter (Signed)
Attempted to call patient for their post-procedure follow-up call. No answer. Left voicemail.   

## 2021-09-24 ENCOUNTER — Ambulatory Visit: Payer: Medicare Other | Admitting: Family Medicine

## 2021-09-24 NOTE — Telephone Encounter (Signed)
FINAL ATTEMPT: ? ?LVM requesting returned call. Letter mailed. ?

## 2021-10-06 ENCOUNTER — Encounter: Payer: Self-pay | Admitting: Gastroenterology

## 2021-10-14 ENCOUNTER — Other Ambulatory Visit: Payer: Self-pay | Admitting: Family Medicine

## 2021-10-14 DIAGNOSIS — B0223 Postherpetic polyneuropathy: Secondary | ICD-10-CM

## 2021-10-14 DIAGNOSIS — G8929 Other chronic pain: Secondary | ICD-10-CM

## 2021-10-14 MED ORDER — HYDROCODONE-ACETAMINOPHEN 5-325 MG PO TABS: 1.0000 | ORAL_TABLET | Freq: Four times a day (QID) | ORAL | 0 refills | Status: DC | PRN

## 2021-10-14 NOTE — Telephone Encounter (Signed)
Patient's wife called to get refill on HYDROcodone-acetaminophen (NORCO/VICODIN) 5-325 MG tablet      Please send to [facility] [address], [place] - [address] Phone:  [phone]  Fax:  [phone]         Good callback number is [phone]      Please advise

## 2021-10-18 ENCOUNTER — Telehealth: Payer: Self-pay | Admitting: Family Medicine

## 2021-10-18 ENCOUNTER — Other Ambulatory Visit: Payer: Self-pay | Admitting: Family Medicine

## 2021-10-18 DIAGNOSIS — B0223 Postherpetic polyneuropathy: Secondary | ICD-10-CM

## 2021-10-18 DIAGNOSIS — M545 Low back pain, unspecified: Secondary | ICD-10-CM

## 2021-10-18 MED ORDER — HYDROCODONE-ACETAMINOPHEN 5-325 MG PO TABS: 1.0000 | ORAL_TABLET | Freq: Four times a day (QID) | ORAL | 0 refills | Status: DC | PRN

## 2021-10-18 NOTE — Telephone Encounter (Signed)
This was sent in by covering physician since I was out of town. Since they picked up the 30 tablets already, I have gone ahead and sent in the typical 100 tablet rx but it won't be eligible for pick up until 11/13 since they just filled the 30 tablets. Reminder to use sparingly and limit use when possible.

## 2021-10-18 NOTE — Telephone Encounter (Signed)
Pt wife Gavin Pound is calling and her husband only received #30 hydocodone on 10-14-2021 and would like #70 of  HYDROcodone-acetaminophen (NORCO/VICODIN) 5-325 MG tablet send to  [facility] [address] [place] - [address] [phone]

## 2021-10-19 NOTE — Telephone Encounter (Signed)
Spoke with Mrs Enis Riecke and informed her of the message below.

## 2021-11-03 NOTE — Telephone Encounter (Signed)
Patient's wife called again to follow up on prescription being sent to [facility] pharmacy. Patient states as of yesterday they are still saying they don't have anything in. Patient states that she will call.

## 2021-11-03 NOTE — Telephone Encounter (Signed)
Left a detailed message at the patient's home number stating the Rx was sent to [facility] on 11/7 to be filled on 11/13 and to contact [facility] with any questions.

## 2021-11-03 NOTE — Telephone Encounter (Signed)
Per last message, patient's wife called stated that there was no prescription in for the HYDROcodone-acetaminophen (NORCO/VICODIN) 5-325 MG tablet as of yesterday.Wife stated that she will call pharmacy again to check and see.     Please send to  [facility] [address], [place] - [address] Phone:  [phone]  Fax:  [phone]           Please advise

## 2021-11-16 ENCOUNTER — Encounter: Payer: Self-pay | Admitting: Gastroenterology

## 2021-11-16 ENCOUNTER — Ambulatory Visit (INDEPENDENT_AMBULATORY_CARE_PROVIDER_SITE_OTHER): Payer: Medicare Other | Admitting: Gastroenterology

## 2021-11-16 VITALS — BP 138/80 | HR 68 | Ht 68.0 in | Wt 148.1 lb

## 2021-11-16 DIAGNOSIS — K209 Esophagitis, unspecified without bleeding: Secondary | ICD-10-CM | POA: Diagnosis not present

## 2021-11-16 DIAGNOSIS — K2970 Gastritis, unspecified, without bleeding: Secondary | ICD-10-CM

## 2021-11-16 DIAGNOSIS — K2990 Gastroduodenitis, unspecified, without bleeding: Secondary | ICD-10-CM | POA: Diagnosis not present

## 2021-11-16 DIAGNOSIS — K921 Melena: Secondary | ICD-10-CM

## 2021-11-16 MED ORDER — ESOMEPRAZOLE MAGNESIUM 40 MG PO CPDR: 40.0000 mg | DELAYED_RELEASE_CAPSULE | Freq: Two times a day (BID) | ORAL | 3 refills | Status: DC

## 2021-11-16 NOTE — Progress Notes (Signed)
Referring Provider: Wynn Banker, MD Primary Care Physician:  Wynn Banker, MD  Chief complaint:  Anemia, FOBT+ stool   IMPRESSION:  LA Class A reflux esophagitis, gastritis, and duodenitis    - clinical symptoms resolved on Nexium 40 mg BID Normocytic anemia with normal iron and ferritin levels, FOBT + stool    - anemia has improved with treatment of esophagitis History of bleeding peptic ulcer disease Chronic constipation controlled with Dulcolax No prior colonoscopy - patient declines   PLAN: - Continue Nexium 40 mg BID with consideration of dose reduction over time if symptoms remain well controlled - Continue to avoid all NSAIDs - Follow-up with Dr. Hassan Rowan with return if anemia returns and/or GI symptoms return  I spent 20 minutes, including independent review of results, face-to-face time with the patient, coordinating care, ordering medications, and documentation.     HPI: Jonathon Martin is a 87 y.o. male who returns in follow-up after endoscopic evaluation for anemia and hemoccult positive stools. His son-in-law who accompanies him to this appointment and contributes to the interval history.   Found to have a positive IFOB during the evaluation of an abnormal CBC. There was no overt bleeding except for one isolated episode of black stools. At the time of his consultation 08/10/21 he reported reflux, eructation, and dull RUQ abdominal pain.  Labs 2019: hemoglobin 13 Labs 03/18/21: fecal occult blood positive stool Labs 06/23/2021: Iron 110, ferritin 131, hemoglobin 12.8, MCV 94.5, RDW 13, platelets 183 Labs 09/21/21: iron 110, ferritin 196.4, hemoglobin 13.2  EGD 09/21/21 revealed LA Class A reflux esophagitis, gastritis, and duodenitis. Gastric biopsies showed intestinal metaplasia in the antrum but were normal in other areas of the stomach. There was no H pylori.   He returns in follow-up after increasing the Nexium to 40 mg BID after the EGD. GI symptoms  have resolved.  He denies ongoing constipation with the use of Dulcolax. Declines colonoscopy.      Past Medical History:  Diagnosis Date   Chronic back pain    Chronic leg pain     Past Surgical History:  Procedure Laterality Date   leg  1962   right leg reconstruction    Prior to Admission medications   Medication Sig Start Date End Date Taking? Authorizing Provider  AMBULATORY NON FORMULARY MEDICATION Rollator 04/02/21   Antoine Primas M, DO  AMBULATORY NON FORMULARY MEDICATION Left AFO 04/02/21   Judi Saa, DO  aspirin EC 81 MG tablet Take 81 mg by mouth daily. Swallow whole.    [provider]  atorvastatin (LIPITOR) 20 MG tablet Take 1 tablet by mouth once daily 07/13/21   Koberlein, Junell C, MD  bisacodyl (DULCOLAX) 5 MG EC tablet Take 5 mg by mouth daily as needed for moderate constipation. Tues and fri to stay regular    [provider]  Calcium Polycarbophil (FIBER-CAPS PO) Take by mouth.    [provider]  donepezil (ARICEPT) 10 MG tablet TAKE 1 TABLET BY MOUTH AT BEDTIME 07/13/21   Koberlein, Junell C, MD  gabapentin (NEURONTIN) 100 MG capsule TAKE 3 CAPSULES BY MOUTH TWICE DAILY 07/13/21   Koberlein, Paris Lore, MD  HYDROcodone-acetaminophen (NORCO/VICODIN) 5-325 MG tablet Take 1 tablet by mouth every 6 (six) hours as needed for moderate pain or severe pain. 07/14/21   Koberlein, Jannette Spanner C, MD  lisinopril (ZESTRIL) 20 MG tablet TAKE 1 TABLET BY MOUTH ONCE DAILY . APPOINTMENT REQUIRED FOR FUTURE REFILLS 07/13/21   Koberlein, Paris Lore,  MD  mirtazapine (REMERON) 15 MG tablet TAKE 1 TABLET BY MOUTH ONCE DAILY AT BEDTIME 07/13/21   Koberlein, Paris Lore, MD  Multiple Vitamin (MULTIVITAMIN) capsule Take 1 capsule by mouth daily.    [provider]    Current Outpatient Medications  Medication Sig Dispense Refill   AMBULATORY NON FORMULARY MEDICATION Rollator 1 Units 0   AMBULATORY NON FORMULARY MEDICATION Left AFO 1 Units 0   aspirin EC 81 MG  tablet Take 81 mg by mouth daily. Swallow whole.     atorvastatin (LIPITOR) 20 MG tablet Take 1 tablet by mouth once daily 90 tablet 0   bisacodyl (DULCOLAX) 5 MG EC tablet Take 5 mg by mouth daily as needed for moderate constipation. Tues and fri to stay regular     Calcium Polycarbophil (FIBER-CAPS PO) Take by mouth.     donepezil (ARICEPT) 10 MG tablet TAKE 1 TABLET BY MOUTH AT BEDTIME 90 tablet 0   gabapentin (NEURONTIN) 100 MG capsule TAKE 3 CAPSULES BY MOUTH TWICE DAILY 540 capsule 0   HYDROcodone-acetaminophen (NORCO/VICODIN) 5-325 MG tablet Take 1 tablet by mouth every 6 (six) hours as needed for moderate pain or severe pain. 100 tablet 0   lisinopril (ZESTRIL) 20 MG tablet TAKE 1 TABLET BY MOUTH ONCE DAILY . APPOINTMENT REQUIRED FOR FUTURE REFILLS 90 tablet 0   mirtazapine (REMERON) 15 MG tablet TAKE 1 TABLET BY MOUTH ONCE DAILY AT BEDTIME 90 tablet 0   Multiple Vitamin (MULTIVITAMIN) capsule Take 1 capsule by mouth daily.     esomeprazole (NEXIUM) 40 MG capsule Take 1 capsule (40 mg total) by mouth 2 (two) times daily before a meal. 180 capsule 3   No current facility-administered medications for this visit.    Allergies as of 11/16/2021   (No Known Allergies)    Family History  Problem Relation Age of Onset   Stroke Mother 52   Epilepsy Sister    Colon cancer Neg Hx    Stomach cancer Neg Hx    Pancreatic cancer Neg Hx    Esophageal cancer Neg Hx    Liver disease Neg Hx    Rectal cancer Neg Hx      Physical Exam: General:   Alert,  well-nourished, pleasant and cooperative in NAD Head:  Normocephalic and atraumatic. Eyes:  Sclera clear, no icterus.   Conjunctiva pink. Ears:  Normal auditory acuity. Nose:  No deformity, discharge,  or lesions. Mouth:  No deformity or lesions.   Neck:  Supple; no masses or thyromegaly. Lungs:  Clear throughout to auscultation.   No wheezes. Heart:  Regular rate and rhythm; no murmurs. Abdomen:  Soft, nontender, nondistended, normal  bowel sounds, no rebound or guarding. No hepatosplenomegaly.   Rectal:  Deferred  Msk:  Symmetrical. No boney deformities LAD: No inguinal or umbilical LAD Extremities:  No clubbing or edema. Neurologic:  Alert and  oriented x4;  grossly nonfocal Skin:  Intact without significant lesions or rashes. Psych:  Alert and cooperative. Normal mood and affect.    Taiyana Kissler L. Orvan Falconer, MD, MPH 11/16/2021, 11:03 AM

## 2021-11-16 NOTE — Patient Instructions (Addendum)
It was my pleasure to provide care to you today. Based on our discussion, I am providing you with my recommendations below:  RECOMMENDATION(S):   PRESCRIPTION MEDICATION(S):   We have sent the following medication(s) to your pharmacy:  Nexium  NOTE: If your medication(s) requires a PRIOR AUTHORIZATION, we will receive notification from your pharmacy. Once received, the process to submit for approval may take up to 7-10 business days. You will be contacted about any denials we have received from your insurance company as well as alternatives recommended by your provider.  FOLLOW UP:  Please follow up with me as needed.  BMI:  If you are age 55 or older, your body mass index should be between 23-30. Your Body mass index is 22.52 kg/m. If this is out of the aforementioned range listed, please consider follow up with your Primary Care Provider.  MY CHART:  The [facility] providers would like to encourage you to use [facility] to communicate with providers for non-urgent requests or questions.  Due to long hold times on the telephone, sending your provider a message by [facility] may be a faster and more efficient way to get a response.  Please allow 48 business hours for a response.  Please remember that this is for non-urgent requests.   Thank you for trusting me with your gastrointestinal care!    Tressia Danas, MD, MPH

## 2021-12-17 ENCOUNTER — Telehealth: Payer: Self-pay | Admitting: Family Medicine

## 2021-12-17 ENCOUNTER — Other Ambulatory Visit: Payer: Self-pay | Admitting: Family Medicine

## 2021-12-17 DIAGNOSIS — G8929 Other chronic pain: Secondary | ICD-10-CM

## 2021-12-17 DIAGNOSIS — B0223 Postherpetic polyneuropathy: Secondary | ICD-10-CM

## 2021-12-17 MED ORDER — HYDROCODONE-ACETAMINOPHEN 5-325 MG PO TABS: 1.0000 | ORAL_TABLET | Freq: Four times a day (QID) | ORAL | 0 refills | Status: DC | PRN

## 2021-12-17 NOTE — Telephone Encounter (Signed)
Pt wife is calling and pt needs a refill on HYDROcodone-acetaminophen (NORCO/VICODIN) 5-325 MG tablet   [facility] [address], [place] - [address] Phone:  [phone]  Fax:  [phone]

## 2022-01-13 ENCOUNTER — Telehealth: Payer: Self-pay | Admitting: Family Medicine

## 2022-01-13 NOTE — Telephone Encounter (Signed)
Pt wife is calling and he needs a refill on HYDROcodone-acetaminophen (NORCO/VICODIN) 5-325 MG tablet sent to  [facility], [place] [address] Phone:  [phone]  Fax:  [phone]

## 2022-01-14 ENCOUNTER — Other Ambulatory Visit: Payer: Self-pay | Admitting: Family Medicine

## 2022-01-14 DIAGNOSIS — G8929 Other chronic pain: Secondary | ICD-10-CM

## 2022-01-14 DIAGNOSIS — M545 Low back pain, unspecified: Secondary | ICD-10-CM

## 2022-01-14 DIAGNOSIS — B0223 Postherpetic polyneuropathy: Secondary | ICD-10-CM

## 2022-01-14 MED ORDER — HYDROCODONE-ACETAMINOPHEN 5-325 MG PO TABS: 1.0000 | ORAL_TABLET | Freq: Four times a day (QID) | ORAL | 0 refills | Status: DC | PRN

## 2022-01-14 NOTE — Telephone Encounter (Signed)
done

## 2022-01-14 NOTE — Telephone Encounter (Signed)
Pt wife is aware medication has been sent to [facility]

## 2022-01-23 ENCOUNTER — Other Ambulatory Visit: Payer: Self-pay | Admitting: Family Medicine

## 2022-02-17 ENCOUNTER — Telehealth: Payer: Self-pay | Admitting: Family Medicine

## 2022-02-17 ENCOUNTER — Other Ambulatory Visit: Payer: Self-pay | Admitting: Family Medicine

## 2022-02-17 DIAGNOSIS — M545 Low back pain, unspecified: Secondary | ICD-10-CM

## 2022-02-17 DIAGNOSIS — B0223 Postherpetic polyneuropathy: Secondary | ICD-10-CM

## 2022-02-17 MED ORDER — HYDROCODONE-ACETAMINOPHEN 5-325 MG PO TABS: 1.0000 | ORAL_TABLET | Freq: Four times a day (QID) | ORAL | 0 refills | Status: DC | PRN

## 2022-02-17 NOTE — Telephone Encounter (Signed)
Patient called in requesting a refill for HYDROcodone-acetaminophen (NORCO/VICODIN) 5-325 MG tablet [[id number]]  to be sent to his pharmacy. ? ?Please advise.  ?

## 2022-04-12 ENCOUNTER — Telehealth: Payer: Self-pay | Admitting: Family Medicine

## 2022-04-12 NOTE — Telephone Encounter (Signed)
Pt wife is calling and pt needs refill on  ?HYDROcodone-acetaminophen (NORCO/VICODIN) 5-325 MG tablet 100 tablet  ? ?[facility] [address], [place] - [address] Phone:  [phone]  ?Fax:  [phone]  ?  ? ?

## 2022-04-13 ENCOUNTER — Other Ambulatory Visit: Payer: Self-pay | Admitting: Family Medicine

## 2022-04-13 DIAGNOSIS — M545 Other chronic pain: Secondary | ICD-10-CM

## 2022-04-13 DIAGNOSIS — B0223 Postherpetic polyneuropathy: Secondary | ICD-10-CM

## 2022-04-13 MED ORDER — HYDROCODONE-ACETAMINOPHEN 5-325 MG PO TABS: 1.0000 | ORAL_TABLET | Freq: Four times a day (QID) | ORAL | 0 refills | Status: DC | PRN

## 2022-04-13 NOTE — Telephone Encounter (Signed)
The system was not letting me sign digitally, so I have printed the rx and signed it. ?

## 2022-04-13 NOTE — Telephone Encounter (Signed)
PCP re-sent electronically due to the escribe system problem being resolved. ?

## 2022-05-03 ENCOUNTER — Ambulatory Visit: Payer: Medicare Other

## 2022-05-03 ENCOUNTER — Telehealth: Payer: Self-pay

## 2022-05-03 NOTE — Telephone Encounter (Signed)
This nurse called patient for telephonic AWV. Spouse answered and stated that his back was hurting and that he was laying down resting. He did not feel up to it today and would like a call to reschedule for another time.

## 2022-05-12 ENCOUNTER — Telehealth: Payer: Self-pay | Admitting: Family Medicine

## 2022-05-12 NOTE — Telephone Encounter (Signed)
Left message for patient to call back and schedule Medicare Annual Wellness Visit (AWV) either virtually or in office. Left  my Zachery Conch number [phone]   Last AWV 04/27/21 ; please schedule at anytime with [facility] Nurse Health Advisor 1 or 2

## 2022-05-12 NOTE — Telephone Encounter (Signed)
Pt requesting refill of HYDROcodone-acetaminophen (NORCO/VICODIN) 5-325 MG tablet   [facility] [address], [place] - [address] Phone:  [phone]  Fax:  [phone]

## 2022-05-13 ENCOUNTER — Other Ambulatory Visit: Payer: Self-pay | Admitting: Family

## 2022-05-13 DIAGNOSIS — G8929 Other chronic pain: Secondary | ICD-10-CM

## 2022-05-13 DIAGNOSIS — B0223 Postherpetic polyneuropathy: Secondary | ICD-10-CM

## 2022-05-13 MED ORDER — HYDROCODONE-ACETAMINOPHEN 5-325 MG PO TABS: 1.0000 | ORAL_TABLET | Freq: Four times a day (QID) | ORAL | 0 refills | Status: DC | PRN

## 2022-05-19 ENCOUNTER — Ambulatory Visit (INDEPENDENT_AMBULATORY_CARE_PROVIDER_SITE_OTHER): Payer: Medicare Other

## 2022-05-19 VITALS — Ht 68.0 in | Wt 148.0 lb

## 2022-05-19 DIAGNOSIS — Z0000 Encounter for general adult medical examination without abnormal findings: Secondary | ICD-10-CM | POA: Diagnosis not present

## 2022-05-19 NOTE — Patient Instructions (Addendum)
[name] , Thank you for taking time to come for your Medicare Wellness Visit. I appreciate your ongoing commitment to your health goals. Please review the following plan we discussed and let me know if I can assist you in the future.   These are the goals we discussed:  Goals       Patient Stated (pt-stated)      Maintain healthy state at this time.        This is a list of the screening recommended for you and due dates:  Health Maintenance  Topic Date Due   COVID-19 Vaccine (3 - Pfizer series) 06/04/2022*   Zoster (Shingles) Vaccine (1 of 2) 08/19/2022*   Flu Shot  07/12/2022   Tetanus Vaccine  10/27/2025   Pneumonia Vaccine  Completed   HPV Vaccine  Aged Out  *Topic was postponed. The date shown is not the original due date.     Opioid Pain Medicine Management Opioids are powerful medicines that are used to treat moderate to severe pain. When used for short periods of time, they can help you to: Sleep better. Do better in physical or occupational therapy. Feel better in the first few days after an injury. Recover from surgery. Opioids should be taken with the supervision of a trained health care provider. They should be taken for the shortest period of time possible. This is because opioids can be addictive, and the longer you take opioids, the greater your risk of addiction. This addiction can also be called opioid use disorder. What are the risks? Using opioid pain medicines for longer than 3 days increases your risk of side effects. Side effects include: Constipation. Nausea and vomiting. Breathing difficulties (respiratory depression). Drowsiness. Confusion. Opioid use disorder. Itching. Taking opioid pain medicine for a long period of time can affect your ability to do daily tasks. It also puts you at risk for: Motor vehicle crashes. Depression. Suicide. Heart attack. Overdose, which can be life-threatening. What is a pain treatment plan? A pain treatment  plan is an agreement between you and your health care provider. Pain is unique to each person, and treatments vary depending on your condition. To manage your pain, you and your health care provider need to work together. To help you do this: Discuss the goals of your treatment, including how much pain you might expect to have and how you will manage the pain. Review the risks and benefits of taking opioid medicines. Remember that a good treatment plan uses more than one approach and minimizes the chance of side effects. Be honest about the amount of medicines you take and about any drug or alcohol use. Get pain medicine prescriptions from only one health care provider. Pain can be managed with many types of alternative treatments. Ask your health care provider to refer you to one or more specialists who can help you manage pain through: Physical or occupational therapy. Counseling (cognitive behavioral therapy). Good nutrition. Biofeedback. Massage. Meditation. Non-opioid medicine. Following a gentle exercise program. How to use opioid pain medicine Taking medicine Take your pain medicine exactly as told by your health care provider. Take it only when you need it. If your pain gets less severe, you may take less than your prescribed dose if your health care provider approves. If you are not having pain, do nottake pain medicine unless your health care provider tells you to take it. If your pain is severe, do nottry to treat it yourself by taking more pills than instructed on  your prescription. Contact your health care provider for help. Write down the times when you take your pain medicine. It is easy to become confused while on pain medicine. Writing the time can help you avoid overdose. Take other over-the-counter or prescription medicines only as told by your health care provider. Keeping yourself and others safe  While you are taking opioid pain medicine: Do not drive, use machinery,  or power tools. Do not sign legal documents. Do not drink alcohol. Do not take sleeping pills. Do not supervise children by yourself. Do not do activities that require climbing or being in high places. Do not go to a lake, river, ocean, spa, or swimming pool. Do not share your pain medicine with anyone. Keep pain medicine in a locked cabinet or in a secure area where pets and children cannot reach it. Stopping your use of opioids If you have been taking opioid medicine for more than a few weeks, you may need to slowly decrease (taper) how much you take until you stop completely. Tapering your use of opioids can decrease your risk of symptoms of withdrawal, such as: Pain and cramping in the abdomen. Nausea. Sweating. Sleepiness. Restlessness. Uncontrollable shaking (tremors). Cravings for the medicine. Do not attempt to taper your use of opioids on your own. Talk with your health care provider about how to do this. Your health care provider may prescribe a step-down schedule based on how much medicine you are taking and how long you have been taking it. Getting rid of leftover pills Do not save any leftover pills. Get rid of leftover pills safely by: Taking the medicine to a prescription take-back program. This is usually offered by the county or law enforcement. Bringing them to a pharmacy that has a drug disposal container. Flushing them down the toilet. Check the label or package insert of your medicine to see whether this is safe to do. Throwing them out in the trash. Check the label or package insert of your medicine to see whether this is safe to do. If it is safe to throw it out, remove the medicine from the original container, put it into a sealable bag or container, and mix it with used coffee grounds, food scraps, dirt, or cat litter before putting it in the trash. Follow these instructions at home: Activity Do exercises as told by your health care provider. Avoid activities  that make your pain worse. Return to your normal activities as told by your health care provider. Ask your health care provider what activities are safe for you. General instructions You may need to take these actions to prevent or treat constipation: Drink enough fluid to keep your urine pale yellow. Take over-the-counter or prescription medicines. Eat foods that are high in fiber, such as beans, whole grains, and fresh fruits and vegetables. Limit foods that are high in fat and processed sugars, such as fried or sweet foods. Keep all follow-up visits. This is important. Where to find support If you have been taking opioids for a long time, you may benefit from receiving support for quitting from a local support group or counselor. Ask your health care provider for a referral to these resources in your area. Where to find more information Centers for Disease Control and Prevention (CDC): FootballExhibition.com.br [place] Food and Drug Administration (FDA): PumpkinSearch.com.ee Get help right away if: You may have taken too much of an opioid (overdosed). Common symptoms of an overdose: Your breathing is slower or more shallow than normal. You have a  very slow heartbeat (pulse). You have slurred speech. You have nausea and vomiting. Your pupils become very small. You have other potential symptoms: You are very confused. You faint or feel like you will faint. You have cold, clammy skin. You have blue lips or fingernails. You have thoughts of harming yourself or harming others. These symptoms may represent a serious problem that is an emergency. Do not wait to see if the symptoms will go away. Get medical help right away. Call your local emergency services (911 in the [place] Do not drive yourself to the hospital.  If you ever feel like you may hurt yourself or others, or have thoughts about taking your own life, get help right away. Go to your nearest emergency department or: Call your local emergency services (911  in the [place] Call the [facility] (56777192761-506-189-5379 in the [place] Call a suicide crisis helpline, such as the National Suicide Prevention Lifeline at 854-402-01281-(562)679-4195 or 988 in the [place] This is open 24 hours a day in the [place] Text the Crisis Text Line at 812 848 0082741741 (in the [place] Summary Opioid medicines can help you manage moderate to severe pain for a short period of time. A pain treatment plan is an agreement between you and your health care provider. Discuss the goals of your treatment, including how much pain you might expect to have and how you will manage the pain. If you think that you or someone else may have taken too much of an opioid, get medical help right away. This information is not intended to replace advice given to you by your health care provider. Make sure you discuss any questions you have with your health care provider. Document Revised: 06/23/2021 Document Reviewed: 03/10/2021 Elsevier Patient Education  2023 Else[employer] Inc[employer]   Advanced directives: No  Conditions/risks identified: None  Next appointment: Follow up in one year for your annual wellness visit.    Preventive Care 6665 Years and Older, Male Preventive care refers to lifestyle choices and visits with your health care provider that can promote health and wellness. What does preventive care include? A yearly physical exam. This is also called an annual well check. Dental exams once or twice a year. Routine eye exams. Ask your health care provider how often you should have your eyes checked. Personal lifestyle choices, including: Daily care of your teeth and gums. Regular physical activity. Eating a healthy diet. Avoiding tobacco and drug use. Limiting alcohol use. Practicing safe sex. Taking low doses of aspirin every day. Taking vitamin and mineral supplements as recommended by your health care provider. What happens during an annual well check? The services and screenings done by your  health care provider during your annual well check will depend on your age, overall health, lifestyle risk factors, and family history of disease. Counseling  Your health care provider may ask you questions about your: Alcohol use. Tobacco use. Drug use. Emotional well-being. Home and relationship well-being. Sexual activity. Eating habits. History of falls. Memory and ability to understand (cognition). Work and work Astronome[employer]vironment. Screening  You may have the following tests or measurements: Height, weight, and BMI. Blood pressure. Lipid and cholesterol levels. These may be checked every 5 years, or more frequently if you are over 86 years old. Skin check. Lung cancer screening. You may have this screening every year starting at age 86 if you have a 30-pack-year history of smoking and currently smoke or have quit within the past 15 years. Fecal occult blood test (FOBT) of  the stool. You may have this test every year starting at age 31. Flexible sigmoidoscopy or colonoscopy. You may have a sigmoidoscopy every 5 years or a colonoscopy every 10 years starting at age 2. Prostate cancer screening. Recommendations will vary depending on your family history and other risks. Hepatitis C blood test. Hepatitis B blood test. Sexually transmitted disease (STD) testing. Diabetes screening. This is done by checking your blood sugar (glucose) after you have not eaten for a while (fasting). You may have this done every 1-3 years. Abdominal aortic aneurysm (AAA) screening. You may need this if you are a current or former smoker. Osteoporosis. You may be screened starting at age 35 if you are at high risk. Talk with your health care provider about your test results, treatment options, and if necessary, the need for more tests. Vaccines  Your health care provider may recommend certain vaccines, such as: Influenza vaccine. This is recommended every year. Tetanus, diphtheria, and acellular pertussis  (Tdap, Td) vaccine. You may need a Td booster every 10 years. Zoster vaccine. You may need this after age 99. Pneumococcal 13-valent conjugate (PCV13) vaccine. One dose is recommended after age 60. Pneumococcal polysaccharide (PPSV23) vaccine. One dose is recommended after age 81. Talk to your health care provider about which screenings and vaccines you need and how often you need them. This information is not intended to replace advice given to you by your health care provider. Make sure you discuss any questions you have with your health care provider. Document Released: 12/25/2015 Document Revised: 08/17/2016 Document Reviewed: 09/29/2015 Elsevier Interactive Patient Education  2017 [employer].  Fall Prevention in the Home Falls can cause injuries. They can happen to people of all ages. There are many things you can do to make your home safe and to help prevent falls. What can I do on the outside of my home? Regularly fix the edges of walkways and driveways and fix any cracks. Remove anything that might make you trip as you walk through a door, such as a raised step or threshold. Trim any bushes or trees on the path to your home. Use bright outdoor lighting. Clear any walking paths of anything that might make someone trip, such as rocks or tools. Regularly check to see if handrails are loose or broken. Make sure that both sides of any steps have handrails. Any raised decks and porches should have guardrails on the edges. Have any leaves, snow, or ice cleared regularly. Use sand or salt on walking paths during winter. Clean up any spills in your garage right away. This includes oil or grease spills. What can I do in the bathroom? Use night lights. Install grab bars by the toilet and in the tub and shower. Do not use towel bars as grab bars. Use non-skid mats or decals in the tub or shower. If you need to sit down in the shower, use a plastic, non-slip stool. Keep the floor dry. Clean  up any water that spills on the floor as soon as it happens. Remove soap buildup in the tub or shower regularly. Attach bath mats securely with double-sided non-slip rug tape. Do not have throw rugs and other things on the floor that can make you trip. What can I do in the bedroom? Use night lights. Make sure that you have a light by your bed that is easy to reach. Do not use any sheets or blankets that are too big for your bed. They should not hang down onto the  floor. Have a firm chair that has side arms. You can use this for support while you get dressed. Do not have throw rugs and other things on the floor that can make you trip. What can I do in the kitchen? Clean up any spills right away. Avoid walking on wet floors. Keep items that you use a lot in easy-to-reach places. If you need to reach something above you, use a strong step stool that has a grab bar. Keep electrical cords out of the way. Do not use floor polish or wax that makes floors slippery. If you must use wax, use non-skid floor wax. Do not have throw rugs and other things on the floor that can make you trip. What can I do with my stairs? Do not leave any items on the stairs. Make sure that there are handrails on both sides of the stairs and use them. Fix handrails that are broken or loose. Make sure that handrails are as long as the stairways. Check any carpeting to make sure that it is firmly attached to the stairs. Fix any carpet that is loose or worn. Avoid having throw rugs at the top or bottom of the stairs. If you do have throw rugs, attach them to the floor with carpet tape. Make sure that you have a light switch at the top of the stairs and the bottom of the stairs. If you do not have them, ask someone to add them for you. What else can I do to help prevent falls? Wear shoes that: Do not have high heels. Have rubber bottoms. Are comfortable and fit you well. Are closed at the toe. Do not wear sandals. If you  use a stepladder: Make sure that it is fully opened. Do not climb a closed stepladder. Make sure that both sides of the stepladder are locked into place. Ask someone to hold it for you, if possible. Clearly mark and make sure that you can see: Any grab bars or handrails. First and last steps. Where the edge of each step is. Use tools that help you move around (mobility aids) if they are needed. These include: Canes. Walkers. Scooters. Crutches. Turn on the lights when you go into a dark area. Replace any light bulbs as soon as they burn out. Set up your furniture so you have a clear path. Avoid moving your furniture around. If any of your floors are uneven, fix them. If there are any pets around you, be aware of where they are. Review your medicines with your doctor. Some medicines can make you feel dizzy. This can increase your chance of falling. Ask your doctor what other things that you can do to help prevent falls. This information is not intended to replace advice given to you by your health care provider. Make sure you discuss any questions you have with your health care provider. Document Released: 09/24/2009 Document Revised: 05/05/2016 Document Reviewed: 01/02/2015 Elsevier Interactive Patient Education  2017 [employer].

## 2022-05-19 NOTE — Progress Notes (Addendum)
Subjective:   Jonathon Martin is a 88 y.o. male who presents for Medicare Annual/Subsequent preventive examination.  Review of Systems    Virtual Visit via Telephone Note  I connected with  Jonathon Martin on 05/19/22 at  1:30 PM EDT by telephone and verified that I am speaking with the correct person using two identifiers.  Location: Patient: Home Provider: Office Persons participating in the virtual visit: patient/Nurse Health Advisor   I discussed the limitations, risks, security and privacy concerns of performing an evaluation and management service by telephone and the availability of in person appointments. The patient expressed understanding and agreed to proceed.  Interactive audio and video telecommunications were attempted between this nurse and patient, however failed, due to patient having technical difficulties OR patient did not have access to video capability.  We continued and completed visit with audio only.  Some vital signs may be absent or patient reported.   Tillie RungBeverly W Akeiba Axelson, LPN  Cardiac Risk Factors include: advanced age (>5755men, 31>65 women);male gender;hypertension     Objective:    Today's Vitals   05/19/22 1330  Weight: 148 lb (67.1 kg)  Height: 5\' 8"  (1.727 m)   Body mass index is 22.5 kg/m.     05/19/2022    1:37 PM 04/27/2021    8:18 AM  Advanced Directives  Does Patient Have a Medical Advance Directive? No Yes  Type of Advance Directive  Healthcare Power of Attorney  Would patient like information on creating a medical advance directive? No - Patient declined     Current Medications (verified) Outpatient Encounter Medications as of 05/19/2022  Medication Sig   AMBULATORY NON FORMULARY MEDICATION Rollator   AMBULATORY NON FORMULARY MEDICATION Left AFO   aspirin EC 81 MG tablet Take 81 mg by mouth daily. Swallow whole.   atorvastatin (LIPITOR) 20 MG tablet Take 1 tablet (20 mg total) by mouth daily.   bisacodyl (DULCOLAX) 5 MG EC tablet Take  5 mg by mouth daily as needed for moderate constipation. Tues and fri to stay regular   Calcium Polycarbophil (FIBER-CAPS PO) Take by mouth.   donepezil (ARICEPT) 10 MG tablet Take 1 tablet (10 mg total) by mouth at bedtime.   esomeprazole (NEXIUM) 40 MG capsule Take 1 capsule (40 mg total) by mouth 2 (two) times daily before a meal.   gabapentin (NEURONTIN) 100 MG capsule TAKE 3 CAPSULES BY MOUTH TWICE DAILY   HYDROcodone-acetaminophen (NORCO/VICODIN) 5-325 MG tablet Take 1 tablet by mouth every 6 (six) hours as needed for moderate pain or severe pain.   lisinopril (ZESTRIL) 20 MG tablet Take 1 tablet (20 mg total) by mouth daily.   mirtazapine (REMERON) 15 MG tablet Take 1 tablet (15 mg total) by mouth at bedtime.   Multiple Vitamin (MULTIVITAMIN) capsule Take 1 capsule by mouth daily.   No facility-administered encounter medications on file as of 05/19/2022.    Allergies (verified) Patient has no known allergies.   History: Past Medical History:  Diagnosis Date   Chronic back pain    Chronic leg pain    Past Surgical History:  Procedure Laterality Date   leg  1962   right leg reconstruction   Family History  Problem Relation Age of Onset   Stroke Mother 5058   Epilepsy Sister    Colon cancer Neg Hx    Stomach cancer Neg Hx    Pancreatic cancer Neg Hx    Esophageal cancer Neg Hx    Liver disease Neg Hx  Rectal cancer Neg Hx    Social History   Socioeconomic History   Marital status: Married    Spouse name: Not on file   Number of children: Not on file   Years of education: Not on file   Highest education level: Not on file  Occupational History   Not on file  Tobacco Use   Smoking status: Former    Types: Cigars   Smokeless tobacco: Never  Vaping Use   Vaping Use: Never used  Substance and Sexual Activity   Alcohol use: Not Currently   Drug use: Never   Sexual activity: Not Currently  Other Topics Concern   Not on file  Social History Narrative   Not on  file   Social Determinants of Health   Financial Resource Strain: Low Risk  (05/19/2022)   Overall Financial Resource Strain (CARDIA)    Difficulty of Paying Living Expenses: Not hard at all  Food Insecurity: No Food Insecurity (05/19/2022)   Hunger Vital Sign    Worried About Running Out of Food in the Last Year: Never true    Ran Out of Food in the Last Year: Never true  Transportation Needs: No Transportation Needs (05/19/2022)   PRAPARE - Administrator, Civil Service (Medical): No    Lack of Transportation (Non-Medical): No  Physical Activity: Sufficiently Active (05/19/2022)   Exercise Vital Sign    Days of Exercise per Week: 7 days    Minutes of Exercise per Session: 30 min  Stress: No Stress Concern Present (05/19/2022)   [employer] of Occupational Health - Occupational Stress Questionnaire    Feeling of Stress : Not at all  Social Connections: Socially Integrated (05/19/2022)   Social Connection and Isolation Panel [NHANES]    Frequency of Communication with Friends and Family: More than three times a week    Frequency of Social Gatherings with Friends and Family: More than three times a week    Attends Religious Services: More than 4 times per year    Active Member of [employer] or Organizations: Yes    Attends Engineer, structural: More than 4 times per year    Marital Status: Married    Tobacco Counseling Counseling given: Not Answered   Clinical Intake: How often do you need to have someone help you when you read instructions, pamphlets, or other written materials from your doctor or pharmacy?: 1 - Never  Diabetic?  No  Interpreter Needed?: No Activities of Daily Living    05/19/2022    1:36 PM  In your present state of health, do you have any difficulty performing the following activities:  Hearing? 0  Vision? 0  Difficulty concentrating or making decisions? 0  Walking or climbing stairs? 0  Dressing or bathing? 0  Doing errands, shopping? 0   Preparing Food and eating ? N  Using the Toilet? N  In the past six months, have you accidently leaked urine? N  Do you have problems with loss of bowel control? N  Managing your Medications? N  Managing your Finances? N  Housekeeping or managing your Housekeeping? N    Patient Care Team: Wynn Banker, MD (Inactive) as PCP - General (Family Medicine)  Indicate any recent Medical Services you may have received from other than [facility] providers in the past year (date may be approximate).     Assessment:   This is a routine wellness examination for Green.  Hearing/Vision screen Hearing Screening - Comments:: No hearing difficulty  Vision Screening - Comments:: Wears glasses.   Dietary issues and exercise activities discussed: Exercise limited by: None identified   Goals Addressed               This Visit's Progress     Patient Stated (pt-stated)        Maintain healthy state at this time.       Depression Screen    05/19/2022    1:34 PM 04/27/2021    8:16 AM 09/04/2020    2:37 PM 02/11/2019    9:18 AM  PHQ 2/9 Scores  PHQ - 2 Score 0 0 0 0    Fall Risk    05/19/2022    1:36 PM 04/27/2021    8:20 AM 02/11/2019    9:17 AM  Fall Risk   Falls in the past year? 0 0 0  Number falls in past yr: 0 0 0  Injury with Fall? 0 0 0  Risk for fall due to : No Fall Risks Impaired vision;Impaired balance/gait;Impaired mobility   Risk for fall due to: Comment  related to back   Follow up  Falls prevention discussed     FALL RISK PREVENTION PERTAINING TO THE HOME:  Any stairs in or around the home? Yes  If so, are there any without handrails? No  Home free of loose throw rugs in walkways, pet beds, electrical cords, etc? Yes  Adequate lighting in your home to reduce risk of falls? Yes   ASSISTIVE DEVICES UTILIZED TO PREVENT FALLS:  Life alert? No  Use of a cane, walker or w/c? Yes  Grab bars in the bathroom? No  Shower chair or bench in shower? Yes  Elevated toilet  seat or a handicapped toilet? Yes   TIMED UP AND GO:  Was the test performed? No . Audio Visit  Cognitive Function:       05/19/2022    1:37 PM 04/27/2021    8:23 AM  6CIT Screen  What Year? 0 points 0 points  What month? 0 points 0 points  What time? 0 points   Count back from 20 0 points 0 points  Months in reverse 0 points 4 points  Repeat phrase 4 points 10 points  Total Score 4 points     Immunizations Immunization History  Administered Date(s) Administered   Fluad Quad(high Dose 65+) 09/04/2020   Influenza, High Dose Seasonal PF 10/01/2018   PFIZER(Purple Top)SARS-COV-2 Vaccination 02/22/2020, 03/17/2020   PNEUMOCOCCAL CONJUGATE-20 03/05/2021   Tdap 10/28/2015    TDAP status: Up to date    Pneumococcal vaccine status: Up to date  Covid-19 vaccine status: Completed vaccines  Qualifies for Shingles Vaccine? Yes   Zostavax completed Yes   Shingrix Completed?: Yes  Screening Tests Health Maintenance  Topic Date Due   COVID-19 Vaccine (3 - Pfizer series) 06/04/2022 (Originally 05/12/2020)   Zoster Vaccines- Shingrix (1 of 2) 08/19/2022 (Originally 03/17/1986)   INFLUENZA VACCINE  07/12/2022   TETANUS/TDAP  10/27/2025   Pneumonia Vaccine 8+ Years old  Completed   HPV VACCINES  Aged Out    Health Maintenance  There are no preventive care reminders to display for this patient.   Colorectal cancer screening: No longer required.   Lung Cancer Screening: (Low Dose CT Chest recommended if Age 75-80 years, 30 pack-year currently smoking OR have quit w/in 15years.) does not qualify.     Additional Screening:  Hepatitis C Screening: does not qualify; Completed   Vision Screening: Recommended annual ophthalmology exams for  early detection of glaucoma and other disorders of the eye. Is the patient up to date with their annual eye exam?  Yes  Who is the provider or what is the name of the office in which the patient attends annual eye exams? Patient unsure If pt  is not established with a provider, would they like to be referred to a provider to establish care? No .   Dental Screening: Recommended annual dental exams for proper oral hygiene  Community Resource Referral / Chronic Care Management:  CRR required this visit?  No   CCM required this visit?  No      Plan:     I have personally reviewed and noted the following in the patient's chart:   Medical and social history Use of alcohol, tobacco or illicit drugs  Current medications and supplements including opioid prescriptions. Patient is currently taking opioid prescriptions. Information provided to patient regarding non-opioid alternatives. Patient advised to discuss non-opioid treatment plan with their provider. Functional ability and status Nutritional status Physical activity Advanced directives List of other physicians Hospitalizations, surgeries, and ER visits in previous 12 months Vitals Screenings to include cognitive, depression, and falls Referrals and appointments  In addition, I have reviewed and discussed with patient certain preventive protocols, quality metrics, and best practice recommendations. A written personalized care plan for preventive services as well as general preventive health recommendations were provided to patient.     Tillie Rung, LPN   04/12/7781   Nurse Notes: None

## 2022-06-08 ENCOUNTER — Telehealth: Payer: Self-pay | Admitting: Family Medicine

## 2022-06-08 ENCOUNTER — Other Ambulatory Visit: Payer: Self-pay | Admitting: Family

## 2022-06-08 NOTE — Telephone Encounter (Signed)
Requesting refill of HYDROcodone-acetaminophen (NORCO/VICODIN) 5-325 MG tablet  [facility] [address], [place] - [address] Phone:  [phone]  Fax:  [phone]

## 2022-06-09 NOTE — Telephone Encounter (Signed)
Spoke with the patient's wife and informed her of the message below.  

## 2022-06-16 ENCOUNTER — Other Ambulatory Visit: Payer: Self-pay | Admitting: Family

## 2022-06-16 DIAGNOSIS — G8929 Other chronic pain: Secondary | ICD-10-CM

## 2022-06-16 DIAGNOSIS — B0223 Postherpetic polyneuropathy: Secondary | ICD-10-CM

## 2022-06-16 MED ORDER — HYDROCODONE-ACETAMINOPHEN 5-325 MG PO TABS: 1.0000 | ORAL_TABLET | Freq: Four times a day (QID) | ORAL | 0 refills | Status: DC | PRN

## 2022-06-16 NOTE — Telephone Encounter (Signed)
Noted  

## 2022-06-16 NOTE — Telephone Encounter (Signed)
Pt wife is calling and would like the refill on HYDROcodone-acetaminophen (NORCO/VICODIN) 5-325 MG tablet it was last refill on 05-13-2022  [facility] [address], [place] - [address] Phone:  [phone]  Fax:  [phone]

## 2022-07-11 ENCOUNTER — Other Ambulatory Visit: Payer: Self-pay | Admitting: *Deleted

## 2022-07-11 MED ORDER — DONEPEZIL HCL 10 MG PO TABS: 10.0000 mg | ORAL_TABLET | Freq: Every day | ORAL | 0 refills | Status: DC

## 2022-07-11 MED ORDER — ATORVASTATIN CALCIUM 20 MG PO TABS: 20.0000 mg | ORAL_TABLET | Freq: Every day | ORAL | 0 refills | Status: DC

## 2022-07-11 MED ORDER — MIRTAZAPINE 15 MG PO TABS: 15.0000 mg | ORAL_TABLET | Freq: Every day | ORAL | 0 refills | Status: DC

## 2022-07-11 MED ORDER — LISINOPRIL 20 MG PO TABS: 20.0000 mg | ORAL_TABLET | Freq: Every day | ORAL | 0 refills | Status: DC

## 2022-07-15 ENCOUNTER — Telehealth: Payer: Self-pay | Admitting: Family Medicine

## 2022-07-15 NOTE — Telephone Encounter (Signed)
I cannot fill this medication without an in person visit- looks like he received 100 tablets on 06/16/22 - he hasn't been seen in the office in over a year

## 2022-07-15 NOTE — Telephone Encounter (Signed)
Patient's wife called to say they are unable to come in any sooner than the already scheduled TOC appointment.

## 2022-07-15 NOTE — Telephone Encounter (Signed)
Left a detailed message with the information below at the patient's home number and requested the patient schedule an appt as below.

## 2022-07-15 NOTE — Telephone Encounter (Signed)
Noted  

## 2022-07-15 NOTE — Telephone Encounter (Signed)
Pt wife call and stated he need a refill on his HYDROcodone-acetaminophen (NORCO/VICODIN) 5-325 MG tablet sent to  [facility] [address], [place] - [address] Phone:  [phone]  Fax:  [phone]

## 2022-08-09 ENCOUNTER — Encounter: Payer: Self-pay | Admitting: Family Medicine

## 2022-08-09 ENCOUNTER — Ambulatory Visit (INDEPENDENT_AMBULATORY_CARE_PROVIDER_SITE_OTHER): Payer: Medicare Other | Admitting: Family Medicine

## 2022-08-09 VITALS — BP 148/76 | HR 54 | Temp 97.6°F | Ht 68.0 in

## 2022-08-09 DIAGNOSIS — F03B0 Unspecified dementia, moderate, without behavioral disturbance, psychotic disturbance, mood disturbance, and anxiety: Secondary | ICD-10-CM | POA: Diagnosis not present

## 2022-08-09 DIAGNOSIS — B0223 Postherpetic polyneuropathy: Secondary | ICD-10-CM | POA: Diagnosis not present

## 2022-08-09 DIAGNOSIS — E785 Hyperlipidemia, unspecified: Secondary | ICD-10-CM

## 2022-08-09 DIAGNOSIS — G8929 Other chronic pain: Secondary | ICD-10-CM | POA: Diagnosis not present

## 2022-08-09 DIAGNOSIS — I10 Essential (primary) hypertension: Secondary | ICD-10-CM

## 2022-08-09 DIAGNOSIS — M545 Low back pain, unspecified: Secondary | ICD-10-CM

## 2022-08-09 DIAGNOSIS — G4700 Insomnia, unspecified: Secondary | ICD-10-CM | POA: Diagnosis not present

## 2022-08-09 MED ORDER — MIRTAZAPINE 15 MG PO TABS: 15.0000 mg | ORAL_TABLET | Freq: Every day | ORAL | 0 refills | Status: DC

## 2022-08-09 MED ORDER — HYDROCODONE-ACETAMINOPHEN 5-325 MG PO TABS: 1.0000 | ORAL_TABLET | Freq: Four times a day (QID) | ORAL | 0 refills | Status: DC | PRN

## 2022-08-09 MED ORDER — LISINOPRIL 20 MG PO TABS: 20.0000 mg | ORAL_TABLET | Freq: Every day | ORAL | 0 refills | Status: DC

## 2022-08-09 MED ORDER — DONEPEZIL HCL 10 MG PO TABS: 10.0000 mg | ORAL_TABLET | Freq: Every day | ORAL | 3 refills | Status: DC

## 2022-08-09 MED ORDER — ATORVASTATIN CALCIUM 20 MG PO TABS: 20.0000 mg | ORAL_TABLET | Freq: Every day | ORAL | 3 refills | Status: DC

## 2022-08-09 NOTE — Patient Instructions (Signed)
Check blood pressure at home daily. Your goal blood pressure is anything less than 140/90, If your blood pressure is consistently above 140, then call the office with your readings and we can discuss increasing your medication.

## 2022-08-09 NOTE — Progress Notes (Unsigned)
Established Patient Office Visit  Subjective   Patient ID: Jonathon Martin, male    DOB: November 15, 1934  Age: 88 y.o. MRN: [id number]  Chief Complaint  Patient presents with   Establish Care    Pt is here for transition of care visit. His son in law is present in the visit with him.  Dementia-- pt reports he thinks his memory is stable, He was able to recall the day, year and president with accuracy (missed the month). He remembers what he ate for breakfast this morning as well. He is on aricept 10 mg daily, denies any side effects to this medication. Son in law comments that he thinks his memory is about the same - hasn't noticed much of a change.  Chronic low back pain-- patient is chronically on norco 5/325 mg every 6 hours as needed for pain. He has a history of significant back injury in the remote past and has very limited ambulation - is able to walk with a walker in his kitchen but uses a wheelchair outside of his home. He reports that the norco does help with his chronic pain. He has a history of CKD stage 3b and cannot take NSAIDS.  HTN-- BP today in office is elevated, pt reports compliance with his medications at home but does not check his BP at home. He reports no chest pain or SOB, no headaches or dizziness.   Current Outpatient Medications  Medication Instructions   AMBULATORY NON FORMULARY MEDICATION Rollator   AMBULATORY NON FORMULARY MEDICATION Left AFO   aspirin EC 81 mg, Oral, Daily, Swallow whole.   atorvastatin (LIPITOR) 20 mg, Oral, Daily   bisacodyl (DULCOLAX) 5 mg, Oral, Daily PRN, Tues and fri to stay regular   Calcium Polycarbophil (FIBER-CAPS PO) Oral   donepezil (ARICEPT) 10 mg, Oral, Daily at bedtime   esomeprazole (NEXIUM) 40 mg, Oral, 2 times daily before meals   HYDROcodone-acetaminophen (NORCO/VICODIN) 5-325 MG tablet 1 tablet, Oral, Every 6 hours PRN   [START ON 10/07/2022] HYDROcodone-acetaminophen (NORCO/VICODIN) 5-325 MG tablet 1 tablet, Oral, Every 6  hours PRN   [START ON 09/08/2022] HYDROcodone-acetaminophen (NORCO/VICODIN) 5-325 MG tablet 1 tablet, Oral, Every 6 hours PRN   lisinopril (ZESTRIL) 20 mg, Oral, Daily   mirtazapine (REMERON) 15 mg, Oral, Daily at bedtime   Multiple Vitamin (MULTIVITAMIN) capsule 1 capsule, Oral, Daily        Review of Systems  All other systems reviewed and are negative.     Objective:     BP (!) 148/76 Comment: repeated by Mykal--jaf  Pulse (!) 54   Temp 97.6 F (36.4 C) (Oral)   Ht 5\' 8"  (1.727 m)   SpO2 100%   BMI 22.50 kg/m    Physical Exam Vitals reviewed.  Constitutional:      Appearance: Normal appearance. He is well-groomed and underweight.  HENT:     Head: Normocephalic and atraumatic.  Eyes:     Extraocular Movements: Extraocular movements intact.     Conjunctiva/sclera: Conjunctivae normal.  Cardiovascular:     Rate and Rhythm: Normal rate and regular rhythm.     Pulses: Normal pulses.     Heart sounds: S1 normal and S2 normal. No murmur heard. Pulmonary:     Effort: Pulmonary effort is normal.     Breath sounds: Normal breath sounds and air entry. No rales.  Abdominal:     General: Abdomen is flat. Bowel sounds are normal.     Palpations: Abdomen is soft.  Musculoskeletal:  Right lower leg: No edema.     Left lower leg: No edema.  Neurological:     General: No focal deficit present.     Mental Status: He is alert.     Gait: Gait is intact.     Comments: He is alert and oriented to person and place, missed the month but was correct on the day and year.  Psychiatric:        Mood and Affect: Mood and affect normal.        The ASCVD Risk score (Arnett DK, et al., 2019) failed to calculate for the following reasons:   The 2019 ASCVD risk score is only valid for ages 63 to 73    Assessment & Plan:   Problem List Items Addressed This Visit       Cardiovascular and Mediastinum   Hypertension - Primary    Chronic, elevated, he is on lisinopril 20 mg  daily, however his BP is not well controlled. We discussed increasing this medication however he is over due for his laboratory work and I will wait for these results before we decide how to adjust his medication.      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 He did not do well with the 3 word memory recall with distraction. Son in law reports his mentation is stable, will continue aricept 10 mg daily.      Relevant Medications   mirtazapine (REMERON) 15 MG tablet   donepezil (ARICEPT) 10 MG tablet     Other   Chronic back pain    Chronic, stable symptoms on the current medications. We reviewed the risks/ benefits of taking chronic long term opioids, patient has chronic back pain from a remote injury and has had extensive right leg surgeries in the past. He cannot take NSAIDS due to CKD stage 3. He is no longer taking the gabapentin due to ineffectiveness. He will need visits every 3 months in order to be in compliance. We can alternate with inperson and video visits. Patient is agreeable to this plan. Will continue his norco 5/325 mg every 6 hours PRN.      Relevant Medications   HYDROcodone-acetaminophen (NORCO/VICODIN) 5-325 MG tablet   HYDROcodone-acetaminophen (NORCO/VICODIN) 5-325 MG tablet (Start on 10/07/2022)   HYDROcodone-acetaminophen (NORCO/VICODIN) 5-325 MG tablet (Start on 09/08/2022)   mirtazapine (REMERON) 15 MG tablet   Hyperlipidemia   Relevant Medications   lisinopril (ZESTRIL) 20 MG tablet   atorvastatin (LIPITOR) 20 MG tablet   Other Relevant Orders   Lipid Panel (Completed)   Insomnia (Chronic)    Chronic, symptoms are stable on remeron 15  mg at bedtime. Patient has lost some weight this year, about 5-8 pounds, he reports he is eating well. Will continue to monitor weight.      Relevant Medications   mirtazapine (REMERON) 15 MG tablet   Other Visit Diagnoses     Postherpetic polyneuropathy       Relevant Medications   HYDROcodone-acetaminophen (NORCO/VICODIN) 5-325 MG tablet   HYDROcodone-acetaminophen (NORCO/VICODIN) 5-325 MG tablet (Start on 10/07/2022)   HYDROcodone-acetaminophen (NORCO/VICODIN) 5-325 MG tablet (Start on 09/08/2022)   mirtazapine (REMERON) 15  MG tablet   donepezil (ARICEPT) 10 MG tablet   Moderate dementia without behavioral disturbance, psychotic disturbance, mood disturbance, or anxiety, unspecified dementia type (HCC)       Relevant Medications   mirtazapine (REMERON) 15 MG tablet   donepezil (ARICEPT) 10 MG tablet       Return in about 3 months (around 11/09/2022) for Video visit for norco refills.    Karie Georges, MD

## 2022-08-10 ENCOUNTER — Encounter (HOSPITAL_COMMUNITY): Payer: Self-pay

## 2022-08-10 ENCOUNTER — Other Ambulatory Visit: Payer: Self-pay

## 2022-08-10 ENCOUNTER — Emergency Department (HOSPITAL_COMMUNITY)
Admission: EM | Admit: 2022-08-10 | Discharge: 2022-08-10 | Disposition: A | Discharge status: Home / Self Care | Payer: Medicare Other | Attending: Student | Admitting: Student

## 2022-08-10 ENCOUNTER — Telehealth: Payer: Self-pay | Admitting: *Deleted

## 2022-08-10 DIAGNOSIS — N189 Chronic kidney disease, unspecified: Secondary | ICD-10-CM | POA: Insufficient documentation

## 2022-08-10 DIAGNOSIS — I129 Hypertensive chronic kidney disease with stage 1 through stage 4 chronic kidney disease, or unspecified chronic kidney disease: Secondary | ICD-10-CM | POA: Diagnosis not present

## 2022-08-10 DIAGNOSIS — Z711 Person with feared health complaint in whom no diagnosis is made: Secondary | ICD-10-CM | POA: Diagnosis not present

## 2022-08-10 DIAGNOSIS — Z7982 Long term (current) use of aspirin: Secondary | ICD-10-CM | POA: Diagnosis not present

## 2022-08-10 DIAGNOSIS — R799 Abnormal finding of blood chemistry, unspecified: Secondary | ICD-10-CM | POA: Diagnosis present

## 2022-08-10 DIAGNOSIS — Z79899 Other long term (current) drug therapy: Secondary | ICD-10-CM | POA: Diagnosis not present

## 2022-08-10 DIAGNOSIS — F0390 Unspecified dementia without behavioral disturbance: Secondary | ICD-10-CM | POA: Insufficient documentation

## 2022-08-10 DIAGNOSIS — F03B0 Unspecified dementia, moderate, without behavioral disturbance, psychotic disturbance, mood disturbance, and anxiety: Secondary | ICD-10-CM | POA: Insufficient documentation

## 2022-08-10 DIAGNOSIS — I10 Essential (primary) hypertension: Secondary | ICD-10-CM | POA: Diagnosis not present

## 2022-08-10 LAB — COMPREHENSIVE METABOLIC PANEL
ALT: 15 U/L (ref 0–53)
ALT: 16 U/L (ref 0–44)
AST: 13 U/L (ref 0–37)
AST: 19 U/L (ref 15–41)
Albumin: 4.5 g/dL (ref 3.5–5.2)
Albumin: 4.3 g/dL (ref 3.5–5.0)
Alkaline Phosphatase: 33 U/L — ABNORMAL LOW (ref 39–117)
Alkaline Phosphatase: 33 U/L — ABNORMAL LOW (ref 38–126)
Anion gap: 9 (ref 5–15)
BUN: 28 mg/dL — ABNORMAL HIGH (ref 6–23)
BUN: 19 mg/dL (ref 8–23)
CO2: 27 mEq/L (ref 19–32)
CO2: 26 mmol/L (ref 22–32)
Calcium: 10.6 mg/dL — ABNORMAL HIGH (ref 8.4–10.5)
Calcium: 10 mg/dL (ref 8.9–10.3)
Chloride: 110 mEq/L (ref 96–112)
Chloride: 107 mmol/L (ref 98–111)
Creatinine, Ser: 2.03 mg/dL — ABNORMAL HIGH (ref 0.40–1.50)
Creatinine, Ser: 1.79 mg/dL — ABNORMAL HIGH (ref 0.61–1.24)
GFR, Estimated: 36 mL/min — ABNORMAL LOW (ref >60)
GFR: 29.16 mL/min — ABNORMAL LOW (ref >60.00)
Glucose, Bld: 124 mg/dL — ABNORMAL HIGH (ref 70–99)
Glucose, Bld: 98 mg/dL (ref 70–99)
Potassium: 6.8 mEq/L (ref 3.5–5.1)
Potassium: 5.1 mmol/L (ref 3.5–5.1)
Sodium: 149 mEq/L — ABNORMAL HIGH (ref 135–145)
Sodium: 142 mmol/L (ref 135–145)
Total Bilirubin: 0.6 mg/dL (ref 0.2–1.2)
Total Bilirubin: 0.8 mg/dL (ref 0.3–1.2)
Total Protein: 7.3 g/dL (ref 6.0–8.3)
Total Protein: 6.6 g/dL (ref 6.5–8.1)

## 2022-08-10 LAB — CBC WITH DIFFERENTIAL/PLATELET
Abs Immature Granulocytes: 0.02 10*3/uL (ref 0.00–0.07)
Basophils Absolute: 0 10*3/uL (ref 0.0–0.1)
Basophils Relative: 0 %
Eosinophils Absolute: 0.2 10*3/uL (ref 0.0–0.5)
Eosinophils Relative: 3 %
HCT: 38.8 % — ABNORMAL LOW (ref 39.0–52.0)
Hemoglobin: 12.8 g/dL — ABNORMAL LOW (ref 13.0–17.0)
Immature Granulocytes: 0 %
Lymphocytes Relative: 18 %
Lymphs Abs: 1.2 10*3/uL (ref 0.7–4.0)
MCH: 32.7 pg (ref 26.0–34.0)
MCHC: 33 g/dL (ref 30.0–36.0)
MCV: 99.2 fL (ref 80.0–100.0)
Monocytes Absolute: 0.6 10*3/uL (ref 0.1–1.0)
Monocytes Relative: 9 %
Neutro Abs: 4.6 10*3/uL (ref 1.7–7.7)
Neutrophils Relative %: 70 %
Platelets: 187 10*3/uL (ref 150–400)
RBC: 3.91 MIL/uL — ABNORMAL LOW (ref 4.22–5.81)
RDW: 12.2 % (ref 11.5–15.5)
WBC: 6.6 10*3/uL (ref 4.0–10.5)
nRBC: 0 % (ref 0.0–0.2)

## 2022-08-10 LAB — LIPID PANEL
Cholesterol: 169 mg/dL (ref 0–200)
HDL: 63.8 mg/dL (ref >39.00)
LDL Cholesterol: 90 mg/dL (ref 0–99)
NonHDL: 104.81
Total CHOL/HDL Ratio: 3
Triglycerides: 76 mg/dL (ref 0.0–149.0)
VLDL: 15.2 mg/dL (ref 0.0–40.0)

## 2022-08-10 LAB — LIPASE, BLOOD: Lipase: 31 U/L (ref 11–51)

## 2022-08-10 NOTE — Assessment & Plan Note (Signed)
Chronic, stable symptoms on the current medications. We reviewed the risks/ benefits of taking chronic long term opioids, patient has chronic back pain from a remote injury and has had extensive right leg surgeries in the past. He cannot take NSAIDS due to CKD stage 3. He is no longer taking the gabapentin due to ineffectiveness. He will need visits every 3 months in order to be in compliance. We can alternate with inperson and video visits. Patient is agreeable to this plan. Will continue his norco 5/325 mg every 6 hours PRN.

## 2022-08-10 NOTE — Assessment & Plan Note (Signed)
Chronic, elevated, he is on lisinopril 20 mg daily, however his BP is not well controlled. We discussed increasing this medication however he is over due for his laboratory work and I will wait for these results before we decide how to adjust his medication.

## 2022-08-10 NOTE — Discharge Instructions (Signed)
Fortunately your potassium level is within normal limit.  Previous value may be due to a lab error.  It is worthwhile for you to follow-up with your doctor for lab recheck if you have any concern.

## 2022-08-10 NOTE — ED Triage Notes (Signed)
Pt arrived POV from home c/o abnormal labs from the doctor and that he was told to come here. Per the pt's family member the doctor said his Kidney function was worse and his potassium elevated.

## 2022-08-10 NOTE — ED Provider Notes (Signed)
[facility] EMERGENCY DEPARTMENT Provider Note   CSN: [id number] Arrival date & time: 08/10/22  1221     History  Chief Complaint  Patient presents with   Abnormal Lab    Jonathon Martin is a 88 y.o. male.  The history is provided by the patient and medical records.  Abnormal Lab    Jonathon Martin is an 88 y/o male with a past medical history of chronic back pain placed on chronic Norco 5/325 mg, hypertension, hypertension and memory impairment who presents to the ED with complaints of hyperkalemia. Patient's son in law reports that his PCP at [facility] recently changed and he went there yesterday to establish care and have routine blood work drawn. Patient then attests that today he received a call from his PCP's office stating that his kidney function had declined and his potassium was elevated and he needed to seek urgent evaluation at the ED. Patient denies any recent symptoms including fevers, chills, chest pain, palpitations, abdominal pain, nausea, vomiting, diarrhea, dysuria, hematuria, or urinary difficulty. Patient denies any known sick contacts or recent infections.  Son-in-law does note that the patient's appetite is low and there are times when patient will only eat meal a day and they will have to supplement his meals. Patient states that he remains hydrated and drinks 2 bottles of water a day.   Home Medications Prior to Admission medications   Medication Sig Start Date End Date Taking? Authorizing Provider  AMBULATORY NON FORMULARY MEDICATION Rollator 04/02/21   Antoine Primas M, DO  AMBULATORY NON FORMULARY MEDICATION Left AFO 04/02/21   Judi Saa, DO  aspirin EC 81 MG tablet Take 81 mg by mouth daily. Swallow whole.    [provider]  atorvastatin (LIPITOR) 20 MG tablet Take 1 tablet (20 mg total) by mouth daily. 08/09/22   Karie Georges, MD  bisacodyl (DULCOLAX) 5 MG EC tablet Take 5 mg by mouth daily as needed for  moderate constipation. Tues and fri to stay regular    [provider]  Calcium Polycarbophil (FIBER-CAPS PO) Take by mouth.    [provider]  donepezil (ARICEPT) 10 MG tablet Take 1 tablet (10 mg total) by mouth at bedtime. 08/09/22   Karie Georges, MD  esomeprazole (NEXIUM) 40 MG capsule Take 1 capsule (40 mg total) by mouth 2 (two) times daily before a meal. 11/16/21   Tressia Danas, MD  HYDROcodone-acetaminophen (NORCO/VICODIN) 5-325 MG tablet Take 1 tablet by mouth every 6 (six) hours as needed for moderate pain or severe pain. 08/09/22   Karie Georges, MD  HYDROcodone-acetaminophen (NORCO/VICODIN) 5-325 MG tablet Take 1 tablet by mouth every 6 (six) hours as needed for moderate pain. 10/07/22   Karie Georges, MD  HYDROcodone-acetaminophen (NORCO/VICODIN) 5-325 MG tablet Take 1 tablet by mouth every 6 (six) hours as needed for moderate pain. 09/08/22   Karie Georges, MD  lisinopril (ZESTRIL) 20 MG tablet Take 1 tablet (20 mg total) by mouth daily. 08/09/22   Karie Georges, MD  mirtazapine (REMERON) 15 MG tablet Take 1 tablet (15 mg total) by mouth at bedtime. 08/09/22   Karie Georges, MD  Multiple Vitamin (MULTIVITAMIN) capsule Take 1 capsule by mouth daily.    [provider]      Allergies    Patient has no known allergies.    Review of Systems   Review of Systems  All other systems reviewed and are negative.  Physical Exam Updated Vital Signs BP (!) 152/81   Pulse (!) 52   Resp 20   Ht 5\' 8"  (1.727 m)   Wt 61.2 kg   SpO2 100%   BMI 20.53 kg/m  Physical Exam Vitals and nursing note reviewed.  Constitutional:      General: He is not in acute distress.    Appearance: He is well-developed.  HENT:     Head: Atraumatic.  Eyes:     Conjunctiva/sclera: Conjunctivae normal.  Cardiovascular:     Rate and Rhythm: Normal rate and regular rhythm.     Pulses: Normal pulses.     Heart sounds: Normal heart sounds.   Pulmonary:     Effort: Pulmonary effort is normal.     Breath sounds: Normal breath sounds.  Abdominal:     Palpations: Abdomen is soft.  Musculoskeletal:     Cervical back: Neck supple.     Comments: 5 out of 5 strength all 4 extremities  Skin:    Findings: No rash.  Neurological:     Mental Status: He is alert. Mental status is at baseline.     ED Results / Procedures / Treatments   Labs (all labs ordered are listed, but only abnormal results are displayed) Labs Reviewed  COMPREHENSIVE METABOLIC PANEL - Abnormal; Notable for the following components:      Result Value   Creatinine, Ser 1.79 (*)    Alkaline Phosphatase 33 (*)    GFR, Estimated 36 (*)    All other components within normal limits  CBC WITH DIFFERENTIAL/PLATELET - Abnormal; Notable for the following components:   RBC 3.91 (*)    Hemoglobin 12.8 (*)    HCT 38.8 (*)    All other components within normal limits  LIPASE, BLOOD  URINALYSIS, ROUTINE W REFLEX MICROSCOPIC    EKG None ED ECG REPORT   Date: 08/10/2022  Rate: 65  Rhythm: normal sinus rhythm and premature atrial contractions (PAC)  QRS Axis: normal  Intervals: normal  ST/T Wave abnormalities: nonspecific ST changes  Conduction Disutrbances:right bundle branch block and left anterior fascicular block  Narrative Interpretation:   Old EKG Reviewed: unchanged  I have personally reviewed the EKG tracing and agree with the computerized printout as noted.   Radiology No results found.  Procedures Procedures    Medications Ordered in ED Medications - No data to display  ED Course/ Medical Decision Making/ A&P                           Medical Decision Making Amount and/or Complexity of Data Reviewed Labs: ordered. ECG/medicine tests: ordered.   BP (!) 152/81   Pulse (!) 52   Resp 20   Ht 5\' 8"  (1.727 m)   Wt 61.2 kg   SpO2 100%   BMI 20.53 kg/m   1:05 PM This is an 88 year old male significant history moderate dementia,  chronic back pain, hypertension, hyperlipidemia accompanied by son-in-law to the ER for evaluation of abnormal lab function.  History obtained through son-in-law and through patient who is at bedside.  Patient recently established care with a new provider.  He was seen yesterday and had regular labs drawn.  He was notified today that his kidney function is worse and his potassium level is elevated and that he needs to come to the ER.  Patient states he has been doing fine.  He denies having any change in his health.  He denies any headache,  cold symptoms, chest pain, shortness of breath, abdominal pain, back pain, joint pain, weakness, decrease in appetite, change in bowel bladder function.  No recent medication changes.  And he has no other complaint.  On exam this is an elderly male laying in bed appears to be in no acute discomfort.  Heart lung sounds normal, abdomen is soft and nontender, patient has equal strength throughout.  He is alert and oriented x2.  No global weakness.  Vital signs remarkable for blood pressure of 152/81, heart rate of 52, no hypoxia,  I have reviewed EMR including labs and imaging.  Labs Values from [facility] from yesterdayr: Na: 149, K: 6.8, Cr: 2.03 (1 year ago: 1.86), GFR: 29.16 (3 years ago: 38.28)   2:11 PM Labs and EKG obtained independently viewed inter by me.  Fortunately repeat potassium today is within normal limit at 5.1.  Creatinine is 1.79 which is similar to his baseline.  EKG does show sinus rhythm with premature atrial complex.  However it has the appearance of atrial flutter in lead II so therefore I will consult cardiology to have them look at the EKG to determine if this is atrial flutter versus sinus rhythm with PAC.  Care discussed with Dr. Posey Rea  2:38 PM Appreciate consultation from cardiologist who did review EKG and noted that patient is in sinus rhythm with PAC instead of atrial flutter.  No additional intervention indicated.  This patient presents  to the ED for concern of hyperkalemia, this involves an extensive number of treatment options, and is a complaint that carries with it a high risk of complications and morbidity.  The differential diagnosis includes drug induced hyperkalemia, dehydration, lab error, CKD  Co morbidities that complicate the patient evaluation CKD  dementia Additional history obtained:  Additional history obtained from family member External records from outside source obtained and reviewed including EMR including prior labs and imaging  Lab Tests:  I Ordered, and personally interpreted labs.  The pertinent results include:  as above    Cardiac Monitoring:  The patient was maintained on a cardiac monitor.  I personally viewed and interpreted the cardiac monitored which showed an underlying rhythm of: sinus rhythm with PAC    Test Considered: as above  Critical Interventions: none  Consultations Obtained:  I requested consultation with the cardiology to review EKG,  and discussed lab and imaging findings as well as pertinent plan - they recommend: none  Problem List / ED Course: erroneous labs  Reevaluation:  After the interventions noted above, I reevaluated the patient and found that they have :stayed the same  Social Determinants of Health: Hx of tobacco use  Dispostion:  After consideration of the diagnostic results and the patients response to treatment, I feel that the patent would benefit from outpt f/u.           Final Clinical Impression(s) / ED Diagnoses Final diagnoses:  Worried well    Rx / DC Orders ED Discharge Orders     None         Fayrene Helper, PA-C 08/10/22 1442    Glendora Score, MD 08/10/22 2014

## 2022-08-10 NOTE — Telephone Encounter (Signed)
Spoke with the patient and his son-in-law Leavy Cella and informed both of the advice below per PCP.  Jasmine agreed to take the patient to the ER.

## 2022-08-10 NOTE — Telephone Encounter (Signed)
CRITICAL VALUE STICKER  CRITICAL VALUE: Potassium 6.8  RECEIVER (on-site recipient of call): Narda Amber  DATE & TIME NOTIFIED: 08/10/2022 at 8:13am  MESSENGER (representative from lab):Shenigua from the [place] lab-ph#[phone]  MD NOTIFIED: PCP-Dr Casimiro Needle via Epic  TIME OF NOTIFICATION:8:13am  RESPONSE:  High priority message sent via Epic

## 2022-08-10 NOTE — Assessment & Plan Note (Signed)
On aricept 10 mg daily, he has good orientation today, only missed the month but can recall short term memories, current events, etc. He did not do well with the 3 word memory recall with distraction. Son in law reports his mentation is stable, will continue aricept 10 mg daily.

## 2022-08-10 NOTE — Assessment & Plan Note (Signed)
Chronic, symptoms are stable on remeron 15 mg at bedtime. Patient has lost some weight this year, about 5-8 pounds, he reports he is eating well. Will continue to monitor weight.

## 2022-08-10 NOTE — Telephone Encounter (Signed)
I reviewed the patient's medications and the rest of his labs-- he looks very dehydrated and his Creatinine is worse. I advise the patient go to the Emergency room for further evaluation and treatment-- it is likely that his kidney function has worsened over time and this is causing elevated potassium but there is not way for me to bring down his potassium without sending him to the ER.

## 2022-10-06 ENCOUNTER — Other Ambulatory Visit: Payer: Self-pay | Admitting: Family Medicine

## 2022-10-06 DIAGNOSIS — G4700 Insomnia, unspecified: Secondary | ICD-10-CM

## 2022-10-06 DIAGNOSIS — I10 Essential (primary) hypertension: Secondary | ICD-10-CM

## 2022-10-17 ENCOUNTER — Telehealth: Payer: Self-pay | Admitting: Family Medicine

## 2022-10-17 NOTE — Telephone Encounter (Signed)
Spoke with Nira Conn, pharmacy tech at [facility] as both Rxs were sent on 10/26.  She stated it may have been too early to fill the Rxs, this will be filled today and they will contact the patient when the Rxs are ready for pick up.

## 2022-10-17 NOTE — Telephone Encounter (Signed)
Patient requesting this be sent to the pharmacy on file, [place], [place] - [place] [address] Phone: [phone]  Fax: [phone]

## 2022-10-17 NOTE — Telephone Encounter (Signed)
Refill 90d mirtazapine (REMERON) 15 MG tablet  lisinopril (ZESTRIL) 20 MG tablet

## 2022-11-10 ENCOUNTER — Other Ambulatory Visit: Payer: Self-pay | Admitting: Family Medicine

## 2022-11-10 ENCOUNTER — Telehealth: Payer: Self-pay | Admitting: Family Medicine

## 2022-11-10 DIAGNOSIS — G4700 Insomnia, unspecified: Secondary | ICD-10-CM

## 2022-11-10 NOTE — Telephone Encounter (Signed)
He was supposed to have a video visit for his refills-- does he have one scheduled? At the very least we will need a telephone visit

## 2022-11-10 NOTE — Telephone Encounter (Signed)
Pt  wife is calling and would like a refill on HYDROcodone-acetaminophen (NORCO/VICODIN) 5-325 MG tablet  [facility] [address], [place] - [address] Phone: [phone]  Fax: [phone]    Pt would like to pick up this sunday

## 2022-11-10 NOTE — Telephone Encounter (Signed)
Pt is sch for phone visit on 11-18-2022

## 2022-11-10 NOTE — Telephone Encounter (Signed)
Left a detailed message at Jonathon Martin cell number stating a visit is required prior to additional refills and to call the office to schedule an appt as below.

## 2022-11-18 ENCOUNTER — Telehealth (INDEPENDENT_AMBULATORY_CARE_PROVIDER_SITE_OTHER): Payer: Medicare Other | Admitting: Family Medicine

## 2022-11-18 ENCOUNTER — Telehealth: Payer: Self-pay | Admitting: Family Medicine

## 2022-11-18 ENCOUNTER — Encounter: Payer: Self-pay | Admitting: Family Medicine

## 2022-11-18 DIAGNOSIS — I10 Essential (primary) hypertension: Secondary | ICD-10-CM

## 2022-11-18 DIAGNOSIS — G8929 Other chronic pain: Secondary | ICD-10-CM

## 2022-11-18 DIAGNOSIS — M545 Low back pain, unspecified: Secondary | ICD-10-CM

## 2022-11-18 MED ORDER — HYDROCODONE-ACETAMINOPHEN 5-325 MG PO TABS: 1.0000 | ORAL_TABLET | Freq: Four times a day (QID) | ORAL | 0 refills | Status: DC | PRN

## 2022-11-18 MED ORDER — LISINOPRIL 20 MG PO TABS
20.0000 mg | ORAL_TABLET | Freq: Every day | ORAL | 3 refills | Status: DC
Start: 2022-11-18 — End: 2023-04-28

## 2022-11-18 NOTE — Assessment & Plan Note (Signed)
Chronic, stable symptoms on the current medications. We reviewed the risks/ benefits of taking chronic long term opioids, patient has chronic back pain from a remote injury and has had extensive right leg surgeries in the past. He cannot take NSAIDS due to CKD stage 3. He is no longer taking the gabapentin due to ineffectiveness. He will continue to be seen every 3 months for medications refills.

## 2022-11-18 NOTE — Progress Notes (Signed)
Established Patient Office Visit  Subjective   Patient ID: Jonathon Martin, male    DOB: 1934/08/30  Age: 88 y.o. MRN: [id number]  Chief Complaint  Patient presents with   Medication Refill    Patient requests a refill on Hydrocodone 5/325mg  due to back pain   I connected with  Jonathon Martin on 11/18/22 by an audio enabled telemedicine application and verified that I am speaking with the correct person using two identifiers.   I discussed the limitations of evaluation and management by telemedicine. The patient expressed understanding and agreed to proceed.   Patient's location: home address  Provider location: [place] Patient is being seen today for controlled substance refills. He reports that the medication is working well to control his pain level. He has no new symptoms or issues to report today.   HTN-- BP at home is slightly elevated per the wife's report. She states that it is high because he was up walking around earlier. He reports compliance with his medication, lisinopril 20 mg daily. He denies any dizziness or chest pain.      Current Outpatient Medications  Medication Instructions   AMBULATORY NON FORMULARY MEDICATION Rollator   AMBULATORY NON FORMULARY MEDICATION Left AFO   aspirin EC 81 mg, Oral, Daily, Swallow whole.   atorvastatin (LIPITOR) 20 mg, Oral, Daily   bisacodyl (DULCOLAX) 5 mg, Oral, Daily PRN, Tues and fri to stay regular   Calcium Polycarbophil (FIBER-CAPS PO) Oral   donepezil (ARICEPT) 10 mg, Oral, Daily at bedtime   esomeprazole (NEXIUM) 40 mg, Oral, 2 times daily before meals   [START ON 01/17/2023] HYDROcodone-acetaminophen (NORCO/VICODIN) 5-325 MG tablet 1 tablet, Oral, Every 6 hours PRN   [START ON 12/18/2022] HYDROcodone-acetaminophen (NORCO/VICODIN) 5-325 MG tablet 1 tablet, Oral, Every 6 hours PRN   HYDROcodone-acetaminophen (NORCO/VICODIN) 5-325 MG tablet 1 tablet, Oral, Every 6 hours PRN   lisinopril (ZESTRIL) 20 mg, Oral, Daily    mirtazapine (REMERON) 15 MG tablet TAKE 1 TABLET BY MOUTH AT BEDTIME. 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     Objective:     BP (!) 147/83 (BP Location: Left Arm, Patient Position: Sitting)   Pulse 68    Physical Exam Neurological:     Mental Status: He is alert. Mental status is at baseline.  Psychiatric:        Mood and Affect: Mood normal.        Thought Content: Thought content normal.      No results found for any visits on 11/18/22.    The ASCVD Risk score (Arnett DK, et al., 2019) failed to calculate for the following reasons:   The 2019 ASCVD risk score is only valid for ages 42 to 74    Assessment & Plan:   Problem List Items Addressed This Visit       Unprioritized   Chronic back pain  Chronic, stable symptoms on the current medications. We reviewed the risks/ benefits of taking chronic long term opioids, patient has chronic back pain from a remote injury and has had extensive right leg surgeries in the past. He cannot take NSAIDS due to CKD stage 3. He is no longer taking the gabapentin due to ineffectiveness. He will continue to be seen every 3 months for medications refills.       Relevant Medications   HYDROcodone-acetaminophen (NORCO/VICODIN) 5-325 MG tablet (Start on 01/17/2023)   HYDROcodone-acetaminophen (NORCO/VICODIN) 5-325 MG tablet  (Start on 12/18/2022)   HYDROcodone-acetaminophen (NORCO/VICODIN) 5-325 MG tablet   Hypertension    BP slightly elevated at home today, could be due to physical activity. I advised he continue his lisinopril 20 mg daily, check BP daily.      Relevant Medications   lisinopril (ZESTRIL) 20 MG tablet  I spent 15 minutes on this audio enable telemedicine visit for medication refills.   Return in about 3 months (around 02/17/2023) for video visit for medication refills.    Karie Georges, MD

## 2022-11-18 NOTE — Assessment & Plan Note (Signed)
BP slightly elevated at home today, could be due to physical activity. I advised he continue his lisinopril 20 mg daily, check BP daily.

## 2022-11-18 NOTE — Telephone Encounter (Signed)
   *  Pt prefers a phone call - please call wife phone [phone]*

## 2022-11-29 ENCOUNTER — Other Ambulatory Visit: Payer: Self-pay | Admitting: Gastroenterology

## 2022-11-29 DIAGNOSIS — K2970 Gastritis, unspecified, without bleeding: Secondary | ICD-10-CM

## 2022-12-19 ENCOUNTER — Telehealth: Payer: Self-pay | Admitting: Family Medicine

## 2022-12-19 NOTE — Telephone Encounter (Signed)
I called [facility] as refills were previously given.  Per Katrina the patient received a text stating the Rxs are ready for pick up.

## 2022-12-19 NOTE — Telephone Encounter (Signed)
HYDROcodone-acetaminophen (NORCO/VICODIN) 5-325 MG tablet lisinopril (ZESTRIL) 20 MG tablet  [facility] [address] [place] [address] Phone: [phone]  Fax: [phone]

## 2023-01-20 ENCOUNTER — Telehealth: Payer: Self-pay | Admitting: Family Medicine

## 2023-01-20 NOTE — Telephone Encounter (Signed)
Prescription Request  01/20/2023  Is this a "Controlled Substance" medicine? No  LOV: 08/09/2022  What is the name of the medication or equipment? HYDROcodone-acetaminophen (NORCO/VICODIN) 5-325 MG tablet   Have you contacted your pharmacy to request a refill? No   Which pharmacy would you like this sent to?  [place], [place] - [id number] [place] [place] [address] Phone: [phone] Fax: [phone]    Patient notified that their request is being sent to the clinical staff for review and that they should receive a response within 2 business days.   Please advise at Mobile [phone] (mobile)

## 2023-01-20 NOTE — Telephone Encounter (Signed)
Spoke with Katrina at [facility], she stated the Rx was received on 12/8, is on file and will be filled for the patient.

## 2023-01-20 NOTE — Telephone Encounter (Signed)
The [facility] should have received a script on 01/17/23-- can you confirm if they got this? Thanks!

## 2023-03-01 ENCOUNTER — Telehealth: Payer: Self-pay | Admitting: Family Medicine

## 2023-03-01 NOTE — Telephone Encounter (Signed)
Prescription Request  03/01/2023  LOV: 08/09/2022  What is the name of the medication or equipment? HYDROcodone-acetaminophen (NORCO/VICODIN) 5-325 MG tablet   Have you contacted your pharmacy to request a refill? No   Which pharmacy would you like this sent to?  [place], [place] - [id number] [place] [place] [address] Phone: [phone] Fax: [phone]    Patient notified that their request is being sent to the clinical staff for review and that they should receive a response within 2 business days.   Please advise at [facility] [phone]

## 2023-03-02 ENCOUNTER — Other Ambulatory Visit: Payer: Self-pay | Admitting: Family Medicine

## 2023-03-02 DIAGNOSIS — M545 Low back pain, unspecified: Secondary | ICD-10-CM

## 2023-03-02 MED ORDER — HYDROCODONE-ACETAMINOPHEN 5-325 MG PO TABS: 1.0000 | ORAL_TABLET | Freq: Four times a day (QID) | ORAL | 0 refills | Status: DC | PRN

## 2023-03-02 NOTE — Telephone Encounter (Addendum)
Pt called to request this refill.  Pt informed that MD requires, at the very least, a VV.  Pt was adamant that he cannot come in person, because he has no one to bring him.  And he cannot do a VV, because he just does not know how.  Explained to Pt that it would be as simple as clicking on the link sent to his mobile.  Pt insisted he will not be able to do it.  Pt stated his daughter or son-in-law might be able to help him do a VV, but he would have to see when they were available.  Pt informed that MD will be OOO all next week.

## 2023-03-02 NOTE — Telephone Encounter (Signed)
Pt needs an appointment for future refills-- I put in his refills for this month-- the DEA recommends visits every 90 days for continued refills on opioid medications. It's ok to schedule him for video visits but he will need one next month.

## 2023-03-02 NOTE — Telephone Encounter (Signed)
Left a detailed message with the information below at the patient's home number. °

## 2023-03-03 NOTE — Telephone Encounter (Signed)
I sent this in yesterday

## 2023-03-03 NOTE — Telephone Encounter (Signed)
Message sent to PCP to send denial via escribe as I cannot send this due to Rx being a narcotic.

## 2023-03-03 NOTE — Telephone Encounter (Signed)
Noted, I already sent in the norco rx yesterday. Ok to close task

## 2023-04-03 ENCOUNTER — Telehealth: Payer: Self-pay | Admitting: Family Medicine

## 2023-04-03 DIAGNOSIS — M545 Other chronic pain: Secondary | ICD-10-CM

## 2023-04-03 MED ORDER — HYDROCODONE-ACETAMINOPHEN 5-325 MG PO TABS: 1.0000 | ORAL_TABLET | Freq: Four times a day (QID) | ORAL | 0 refills | Status: DC | PRN

## 2023-04-03 NOTE — Telephone Encounter (Signed)
Prescription Request  04/03/2023  LOV: 08/09/2022  What is the name of the medication or equipment? HYDROcodone-acetaminophen (NORCO/VICODIN) 5-325 MG tablet   Have you contacted your pharmacy to request a refill? No   Which pharmacy would you like this sent to?  [facility] [address], [place] - [address] [place] [address] Phone: [phone] Fax: [phone]    Patient notified that their request is being sent to the clinical staff for review and that they should receive a response within 2 business days.   Please advise at Mobile [phone] (mobile)

## 2023-04-03 NOTE — Telephone Encounter (Signed)
I will fill this but he hasn't been seen since August-- for legal purposes the patient needs to be seen at the very least every 6 months-- they can do a video visit if they can't bring him into the office but I cannot continue to prescribe narcotics without seeing the patient regularly in the office per the DEA's recommendations.

## 2023-04-03 NOTE — Telephone Encounter (Signed)
Left a detailed message with the information below at the patient's cell number for Mrs Jaeshaun Riva.

## 2023-04-12 IMAGING — DX DG LUMBAR SPINE COMPLETE 4+V
5 series · 5 of 5 positions shown · non-contrast
Comparison: None.

CLINICAL DATA: Chronic lower back pain radiating to the left knee
and left ankle

EXAM:
LUMBAR SPINE - COMPLETE 4+ VIEW

[l-spine ap]
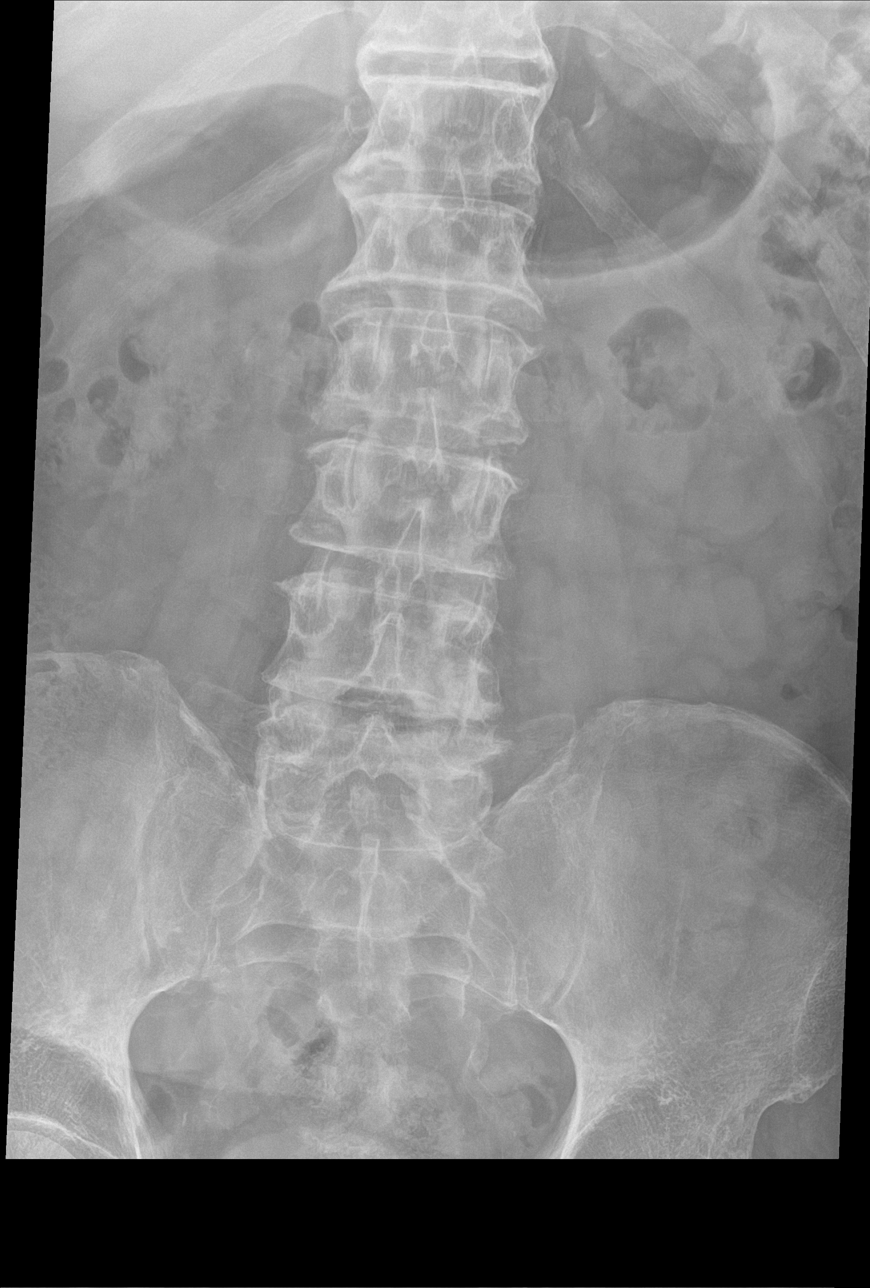

[l-spine obl (1 of 2)]
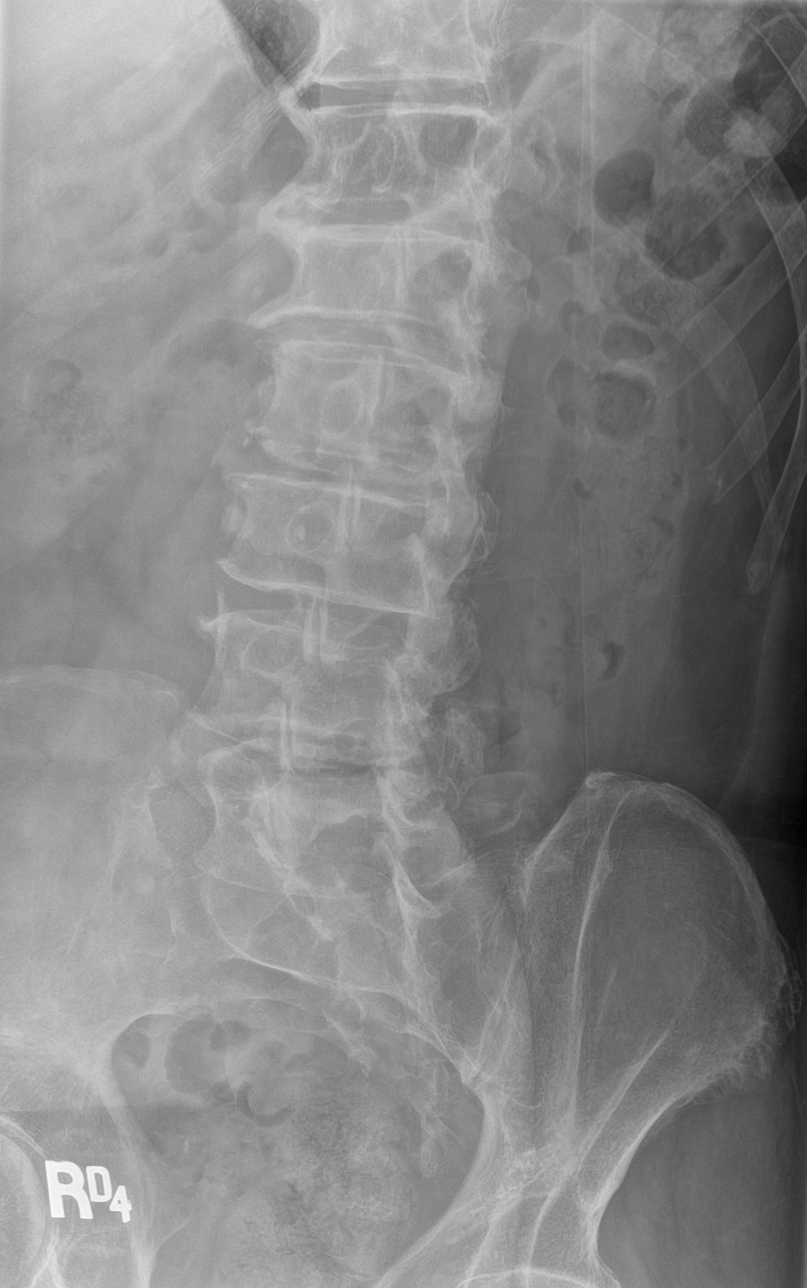

[l-spine obl (2 of 2)]
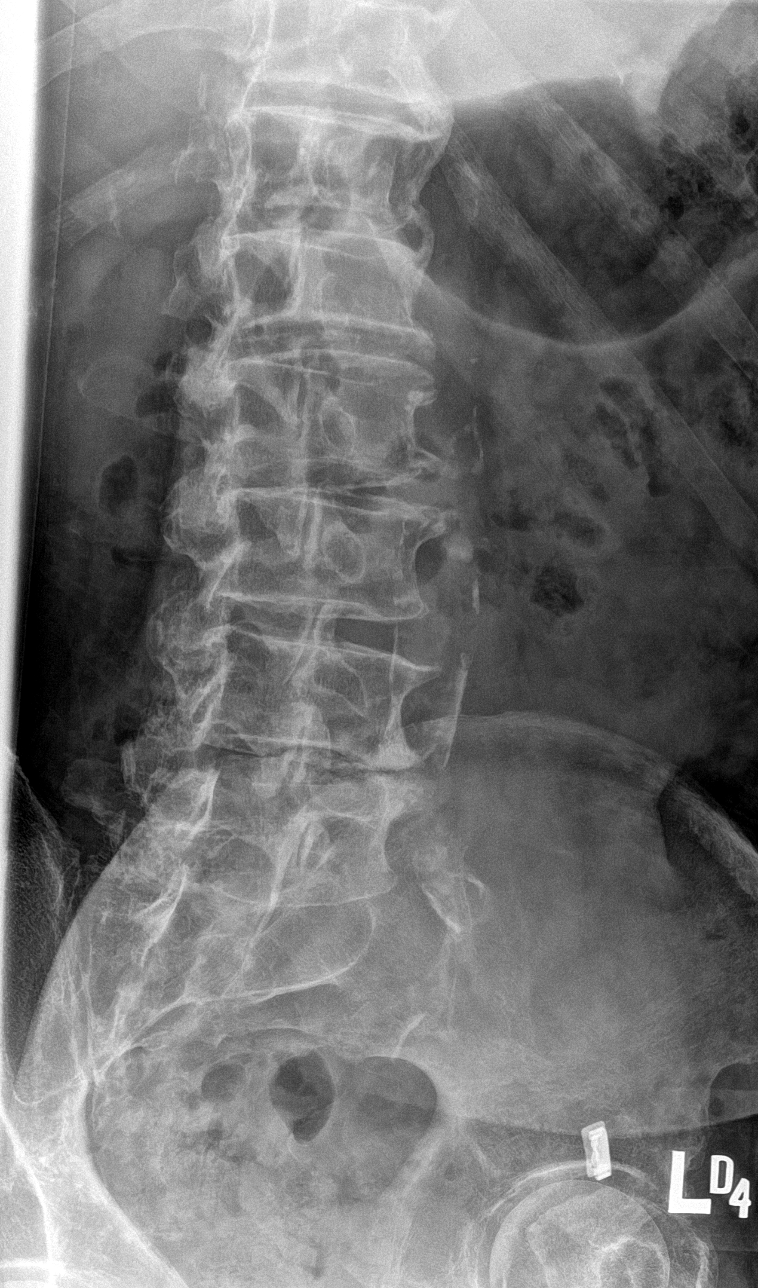

[l-spine lateral]
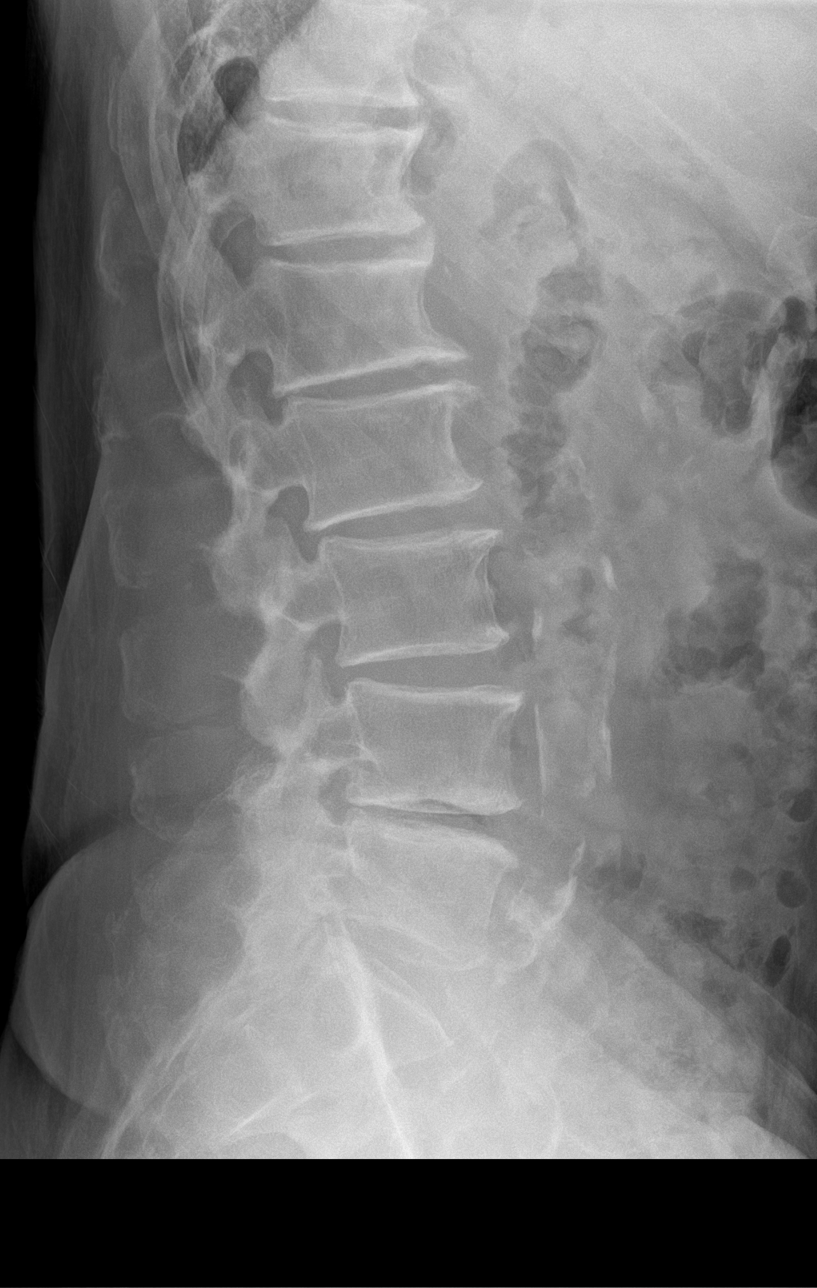

[l-spine spot]
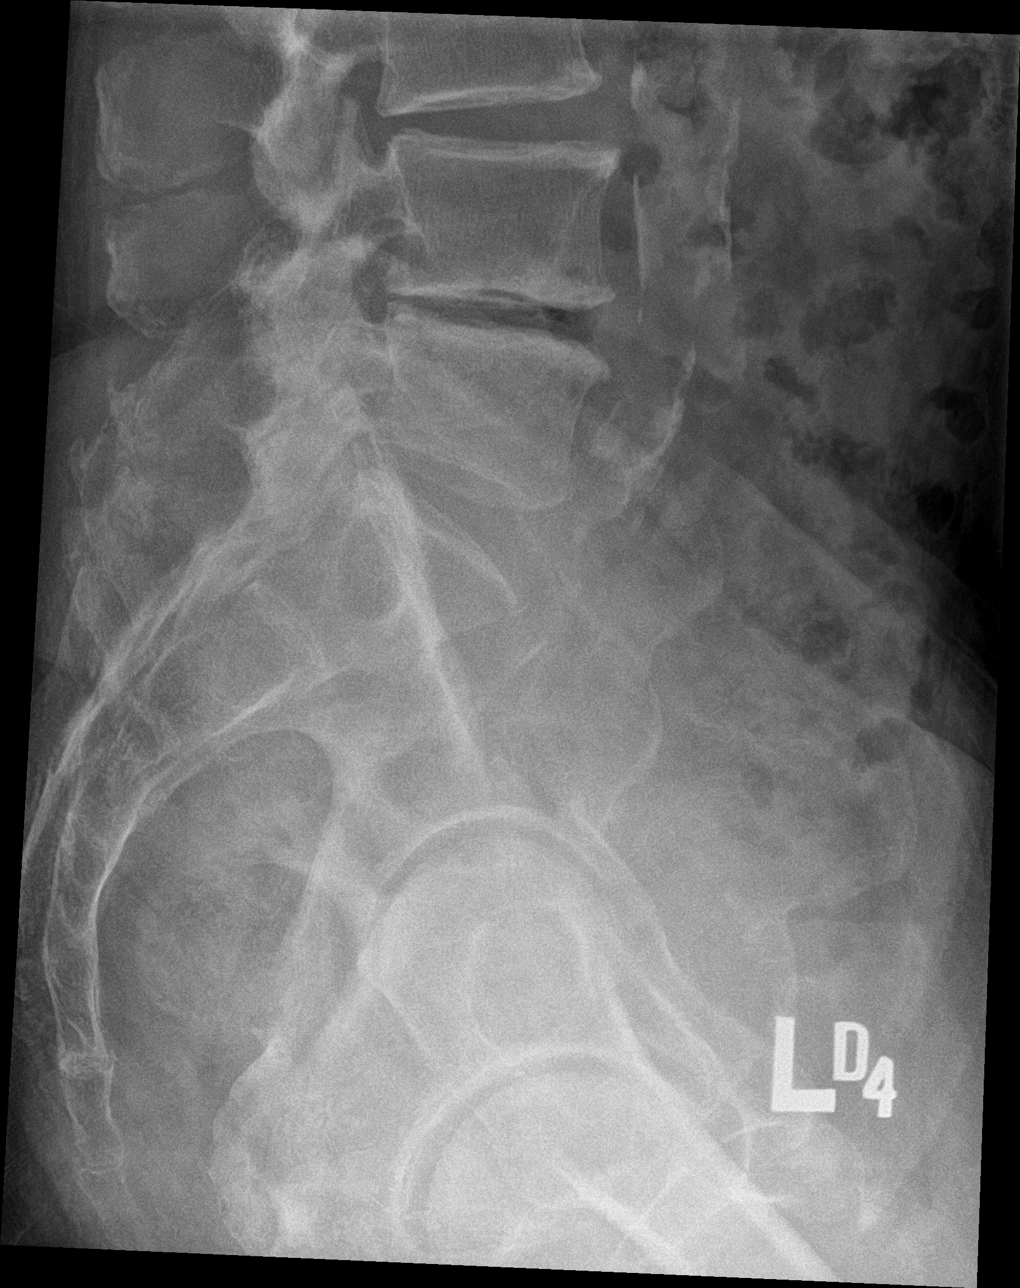

[5 of 5 positions shown; findings below may reference images not displayed]

FINDINGS: There is no evidence of lumbar spine fracture. Alignment is normal.
Multilevel disc space narrowing most prominent at C4-C5 where it is
at least moderate. Lower lumbar predominant facet hypertrophy.
Aortic atherosclerosis.
IMPRESSION: 1. No acute fracture or malalignment of the lumbar spine.
2. Multilevel degenerative changes, most prominent at C4-C5.

## 2023-04-17 ENCOUNTER — Encounter: Payer: Self-pay | Admitting: Family Medicine

## 2023-04-18 ENCOUNTER — Telehealth: Payer: Self-pay | Admitting: Family Medicine

## 2023-04-18 NOTE — Telephone Encounter (Signed)
Patient dropped off document FL2, to be filled out by provider. Patient requested to send it via Call Patient to pick up within 5-days. Document is located in providers tray at front office.Please advise at Mobile [phone] (mobile)

## 2023-04-19 NOTE — Telephone Encounter (Signed)
PCP completed the forms, stated the patient needs a TB skin test, to attach the medication list and last office notes.  Spoke with the patient's son-in-law and informed him PCP completed the forms, there is a $29 fee and these were left at the front desk (along with a medication list and last office notes).  Mr Antrum scheduled a nurse's visit for a TB skin test on 5/20.  Copy sent to be scanned.

## 2023-04-28 ENCOUNTER — Encounter: Payer: Self-pay | Admitting: Family Medicine

## 2023-04-28 ENCOUNTER — Ambulatory Visit (INDEPENDENT_AMBULATORY_CARE_PROVIDER_SITE_OTHER): Payer: Medicare Other | Admitting: Family Medicine

## 2023-04-28 VITALS — BP 160/90 | HR 55 | Temp 97.9°F | Ht 68.0 in

## 2023-04-28 DIAGNOSIS — E785 Hyperlipidemia, unspecified: Secondary | ICD-10-CM | POA: Diagnosis not present

## 2023-04-28 DIAGNOSIS — I10 Essential (primary) hypertension: Secondary | ICD-10-CM

## 2023-04-28 DIAGNOSIS — G8929 Other chronic pain: Secondary | ICD-10-CM | POA: Diagnosis not present

## 2023-04-28 DIAGNOSIS — Z111 Encounter for screening for respiratory tuberculosis: Secondary | ICD-10-CM

## 2023-04-28 DIAGNOSIS — M545 Low back pain, unspecified: Secondary | ICD-10-CM | POA: Diagnosis not present

## 2023-04-28 LAB — COMPREHENSIVE METABOLIC PANEL
ALT: 15 U/L (ref 0–53)
AST: 14 U/L (ref 0–37)
Albumin: 4.4 g/dL (ref 3.5–5.2)
Alkaline Phosphatase: 46 U/L (ref 39–117)
BUN: 24 mg/dL — ABNORMAL HIGH (ref 6–23)
CO2: 30 mEq/L (ref 19–32)
Calcium: 9.8 mg/dL (ref 8.4–10.5)
Chloride: 104 mEq/L (ref 96–112)
Creatinine, Ser: 1.45 mg/dL (ref 0.40–1.50)
GFR: 43.45 mL/min — ABNORMAL LOW (ref >60.00)
Glucose, Bld: 78 mg/dL (ref 70–99)
Potassium: 4.1 mEq/L (ref 3.5–5.1)
Sodium: 142 mEq/L (ref 135–145)
Total Bilirubin: 0.4 mg/dL (ref 0.2–1.2)
Total Protein: 7.3 g/dL (ref 6.0–8.3)

## 2023-04-28 LAB — LIPID PANEL
Cholesterol: 179 mg/dL (ref 0–200)
HDL: 74.2 mg/dL (ref >39.00)
LDL Cholesterol: 92 mg/dL (ref 0–99)
NonHDL: 104.4
Total CHOL/HDL Ratio: 2
Triglycerides: 63 mg/dL (ref 0.0–149.0)
VLDL: 12.6 mg/dL (ref 0.0–40.0)

## 2023-04-28 MED ORDER — HYDROCODONE-ACETAMINOPHEN 5-325 MG PO TABS: 1.0000 | ORAL_TABLET | Freq: Four times a day (QID) | ORAL | 0 refills | Status: DC | PRN

## 2023-04-28 MED ORDER — LISINOPRIL 40 MG PO TABS: 40.0000 mg | ORAL_TABLET | Freq: Every day | ORAL | 3 refills | Status: DC

## 2023-04-28 NOTE — Assessment & Plan Note (Signed)
Bp not well controlled on 20 mg daily, will increase to 40 mg daily of lisinopril and son will have the ALF check his BP in 2 weeks. RTC 3 months

## 2023-04-28 NOTE — Progress Notes (Signed)
Established Patient Office Visit  Subjective   Patient ID: Jonathon Martin, male    DOB: 1934/08/19  Age: 89 y.o. MRN: [id number]  Chief Complaint  Patient presents with   Medication Consultation    Patient is here for follow up today and medication refills. Patient will be moving to assisted living in about a week.   Chronic pain-- pt reports his pain is usually controlled with the norco 5/325 mg. He is taking it TID and sometimes taking a 4th time depending on his pain level. Son is present in the visit and he reports that the patient has significant difficulty getting up out a chair, sometimes a lot of trouble with stairs, will often comment that his back is hurting at times throughout the day. We discussed increasing the quantity to 4 times a day and he is agreeable to this.   HTN-- BP is elevated today. He denies any headaches, sob or chest pain. We discussed adjusting his medications and they are agreeable. Son thinks that the assisted living facility will be able to check his blood pressure    Current Outpatient Medications  Medication Instructions   AMBULATORY NON FORMULARY MEDICATION Rollator   AMBULATORY NON FORMULARY MEDICATION Left AFO   aspirin EC 81 mg, Oral, Daily, Swallow whole.   atorvastatin (LIPITOR) 20 mg, Oral, Daily   bisacodyl (DULCOLAX) 5 mg, Oral, Daily PRN, Tues and fri to stay regular   Calcium Polycarbophil (FIBER-CAPS PO) Oral   donepezil (ARICEPT) 10 mg, Oral, Daily at bedtime   esomeprazole (NEXIUM) 40 MG capsule TAKE 1 CAPSULE BY MOUTH TWICE DAILY BEFORE A MEAL   HYDROcodone-acetaminophen (NORCO/VICODIN) 5-325 MG tablet 1 tablet, Oral, Every 6 hours PRN   [START ON 05/26/2023] HYDROcodone-acetaminophen (NORCO/VICODIN) 5-325 MG tablet 1 tablet, Oral, Every 6 hours PRN   [START ON 06/26/2023] HYDROcodone-acetaminophen (NORCO/VICODIN) 5-325 MG tablet 1 tablet, Oral, Every 6 hours PRN   lisinopril (ZESTRIL) 40 mg, Oral, Daily   mirtazapine (REMERON) 15 MG  tablet TAKE 1 TABLET BY MOUTH AT BEDTIME. 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     Objective:     BP (!) 160/90 (BP Location: Left Arm, Patient Position: Sitting, Cuff Size: Normal)   Pulse (!) 55   Temp 97.9 F (36.6 C) (Oral)   Ht 5\' 8"  (1.727 m)   SpO2 98%   BMI 20.53 kg/m  BP Readings from Last 3 Encounters:  04/28/23 (!) 160/90  11/18/22 (!) 147/83  08/10/22 135/61      Physical Exam Vitals reviewed.  Constitutional:      Appearance: Normal appearance. He is well-groomed and underweight.  Cardiovascular:     Rate and Rhythm: Normal rate and regular rhythm.     Heart sounds: S1 normal and S2 normal. No murmur heard. Pulmonary:     Effort: Pulmonary effort is normal.     Breath sounds: Normal breath  sounds and air entry. No rales.  Abdominal:     General: Bowel sounds are normal.  Musculoskeletal:     Right lower leg: No edema.     Left lower leg: No edema.  Neurological:     General: No focal deficit present.     Mental Status: He is alert. Mental status is at baseline.     Gait: Gait is intact.  Psychiatric:        Mood and Affect: Mood and affect normal.      No results found for any visits on 04/28/23.    The ASCVD Risk score (Arnett DK, et al., 2019) failed to calculate for the following reasons:   The 2019 ASCVD risk score is only valid for  ages 40 to 38    Assessment & Plan:  Primary hypertension Assessment & Plan: Bp not well controlled on 20 mg daily, will increase to 40 mg daily of lisinopril and son will have the ALF check his BP in 2 weeks. RTC 3 months  Orders: -     Comprehensive metabolic panel -     Lisinopril; Take 1 tablet (40 mg total) by mouth daily.  Dispense: 90 tablet; Refill: 3  Chronic midline low back pain without sciatica Assessment & Plan: Chronic, stable symptoms on the current medications. We reviewed the risks/ benefits of taking chronic long term opioids, patient has chronic back pain from a remote injury and has had extensive right leg surgeries in the past. He cannot take NSAIDS due to CKD stage 3. He is no longer taking the gabapentin due to ineffectiveness. He has difficult coming in every 3 months for medication refills due to mobility/transportation issues. We reviewed the DEA recommendations of visit every 3 months. Will see him as often as the situation will allow.   Orders: -     HYDROcodone-Acetaminophen; Take 1 tablet by mouth every 6 (six) hours as needed for moderate pain or severe pain.  Dispense: 120 tablet; Refill: 0 -     HYDROcodone-Acetaminophen; Take 1 tablet by mouth every 6 (six) hours as needed for moderate pain.  Dispense: 120 tablet; Refill: 0 -     HYDROcodone-Acetaminophen; Take 1 tablet by mouth every 6 (six) hours as needed for moderate pain.  Dispense: 120 tablet; Refill: 0  Screening-pulmonary TB -     QuantiFERON-TB Gold Plus  Hyperlipidemia, unspecified hyperlipidemia type -     Lipid panel   Checking new lipid panel today.on lipitor 20 mg daily currently, pt reports compliance with the medication.  Return in about 3 months (around 07/29/2023) for video visit for medication refills.    Karie Georges, MD

## 2023-04-28 NOTE — Assessment & Plan Note (Addendum)
Chronic, stable symptoms on the current medications. We reviewed the risks/ benefits of taking chronic long term opioids, patient has chronic back pain from a remote injury and has had extensive right leg surgeries in the past. He cannot take NSAIDS due to CKD stage 3. He is no longer taking the gabapentin due to ineffectiveness. He has difficult coming in every 3 months for medication refills due to mobility/transportation issues. We reviewed the DEA recommendations of visit every 3 months. Will see him as often as the situation will allow.

## 2023-04-28 NOTE — Patient Instructions (Addendum)
Increase lisinopril to 40 mg daily (may take 2 tablets of the 20 mg to equal 40 mg)   Call [place] [facility] and find out if there is a specific company to use for the motorized wheelchair.

## 2023-05-01 ENCOUNTER — Ambulatory Visit: Payer: Medicare Other

## 2023-05-01 LAB — QUANTIFERON-TB GOLD PLUS
Mitogen-NIL: 3.54 IU/mL
NIL: 0.01 IU/mL
QuantiFERON-TB Gold Plus: NEGATIVE
TB1-NIL: 0 IU/mL
TB2-NIL: 0.01 IU/mL

## 2023-05-10 ENCOUNTER — Telehealth: Payer: Self-pay | Admitting: Family Medicine

## 2023-05-10 DIAGNOSIS — M545 Low back pain, unspecified: Secondary | ICD-10-CM

## 2023-05-10 NOTE — Telephone Encounter (Signed)
Requesting refill of HYDROcodone-acetaminophen (NORCO/VICODIN) 5-325 MG tablet     [facility] - [place], [place] - [address] Phone: [phone]  Fax: [phone]     Needs order to specifically say patient needs to take at 8am, 12pm and 8pm. *Asking for confirmation if patient should be taking 2 at bedtime and 1 throughout the day?

## 2023-05-11 MED ORDER — HYDROCODONE-ACETAMINOPHEN 5-325 MG PO TABS: ORAL_TABLET | ORAL | 0 refills | Status: DC

## 2023-05-11 NOTE — Telephone Encounter (Signed)
Ok I fixed it. 

## 2023-05-11 NOTE — Addendum Note (Signed)
Addended by: Karie Georges on: 05/11/2023 10:17 AM   Modules accepted: Orders

## 2023-05-30 ENCOUNTER — Encounter: Payer: Self-pay | Admitting: Family Medicine

## 2023-05-30 ENCOUNTER — Ambulatory Visit (INDEPENDENT_AMBULATORY_CARE_PROVIDER_SITE_OTHER): Payer: Medicare Other | Admitting: Family Medicine

## 2023-05-30 DIAGNOSIS — Z0000 Encounter for general adult medical examination without abnormal findings: Secondary | ICD-10-CM | POA: Diagnosis not present

## 2023-05-30 NOTE — Patient Instructions (Signed)
I really enjoyed getting to talk with you today! I am available on Tuesdays and Thursdays for virtual visits if you have any questions or concerns, or if I can be of any further assistance.   CHECKLIST FROM ANNUAL WELLNESS VISIT:  -Follow up (please call to schedule if not scheduled after visit):   -yearly for annual wellness visit with primary care office  Here is a list of your preventive care/health maintenance measures and the plan for each if any are due:  PLAN For any measures below that may be due:   Health Maintenance  Topic Date Due   COVID-19 Vaccine (3 - 2023-24 season) 06/15/2023 (Originally 08/12/2022)   Zoster Vaccines- Shingrix (1 of 2) 08/30/2023 (Originally [birth year]/17/1986)   INFLUENZA VACCINE  07/13/2023   Medicare Annual Wellness (AWV)  05/29/2024   DTaP/Tdap/Td (2 - Td or Tdap) 10/27/2025   Pneumonia Vaccine 80+ Years old  Completed   HPV VACCINES  Aged Out    -See a dentist at least yearly  -Get your eyes checked and then per your eye specialist's recommendations  -Other issues addressed today:   -I have included below further information regarding a healthy whole foods based diet, physical activity guidelines for adults, stress management and opportunities for social connections. I hope you find this information useful.   -----------------------------------------------------------------------------------------------------------------------------------------------------------------------------------------------------------------------------------------------------------  NUTRITION: -eat real food: lots of colorful vegetables (half the plate) and fruits -5-7 servings of vegetables and fruits per day (fresh or steamed is best), exp. 2 servings of vegetables with lunch and dinner and 2 servings of fruit per day. Berries and greens such as kale and collards are great choices.  -consume on a regular basis: whole grains (make sure first ingredient on label contains the word  "whole"), fresh fruits, fish, nuts, seeds, healthy oils (such as olive oil, avocado oil, grape seed oil) -may eat small amounts of dairy and lean meat on occasion, but avoid processed meats such as ham, bacon, lunch meat, etc. -drink water -try to avoid fast food and pre-packaged foods, processed meat -most experts advise limiting sodium to < 2300mg  per day, should limit further is any chronic conditions such as high blood pressure, heart disease, diabetes, etc. The American Heart Association advised that < 1500mg  is is ideal -try to avoid foods that contain any ingredients with names you do not recognize  -try to avoid sugar/sweets (except for the natural sugar that occurs in fresh fruit) -try to avoid sweet drinks -try to avoid white rice, white bread, pasta (unless whole grain), white or yellow potatoes  EXERCISE GUIDELINES FOR ADULTS: -if you wish to increase your physical activity, do so gradually and with the approval of your doctor -STOP and seek medical care immediately if you have any chest pain, chest discomfort or trouble breathing when starting or increasing exercise  -move and stretch your body, legs, feet and arms when sitting for long periods -Physical activity guidelines for optimal health in adults: -least 150 minutes per week of aerobic exercise (can talk, but not sing) once approved by your doctor, 20-30 minutes of sustained activity or two 10 minute episodes of sustained activity every day.  -resistance training at least 2 days per week if approved by your doctor -balance exercises 3+ days per week:   Stand somewhere where you have something sturdy to hold onto if you lose balance.    1) lift up on toes, start with 5x per day and work up to 20x   2) stand and lift on leg straight  out to the side so that foot is a few inches of the floor, start with 5x each side and work up to 20x each side   3) stand on one foot, start with 5 seconds each side and work up to 20 seconds on  each side  If you need ideas or help with getting more active:  -Silver sneakers https://tools.silversneakers.com  -Walk with a Doc: http://www.duncan-williams.com/  -try to include resistance (weight lifting/strength building) and balance exercises twice per week: or the following link for ideas: http://castillo-powell.com/  BuyDucts.dk  STRESS MANAGEMENT: -can try meditating, or just sitting quietly with deep breathing while intentionally relaxing all parts of your body for 5 minutes daily -if you need further help with stress, anxiety or depression please follow up with your primary doctor or contact the wonderful folks at [facility]: [phone]  SOCIAL CONNECTIONS: -options in [place] if you wish to engage in more social and exercise related activities:  -Silver sneakers https://tools.silversneakers.com  -Walk with a Doc: http://www.duncan-williams.com/  -Check out the [facility] Active Adults 50+ section on the [place] [employer] (hiking clubs, book clubs, cards and games, chess, exercise classes, aquatic classes and much more) - see the website for details: https://www.[place]-.gov/departments/parks-recreation/active-adults50  -YouTube has lots of exercise videos for different ages and abilities as well  -[facility] (a variety of indoor and outdoor inperson activities for adults). [phone]. [address]  -Virtual Online Classes (a variety of topics): see seniorplanet.org or call [phone]  -consider volunteering at a school, hospice center, church, senior center or elsewhere

## 2023-05-30 NOTE — Progress Notes (Signed)
PATIENT CHECK-IN and HEALTH RISK ASSESSMENT QUESTIONNAIRE:  -completed by phone/video for upcoming Medicare Preventive Visit  Pre-Visit Check-in: 1)Vitals (height, wt, BP, etc) - record in vitals section for visit on day of visit 2)Review and Update Medications, Allergies PMH, Surgeries, Social history in Epic 3)Hospitalizations in the last year with date/reason? No  4)Review and Update Care Team (patient's specialists) in Epic 5) Complete PHQ9 in Epic  6) Complete Fall Screening in Epic 7)Review all Health Maintenance Due and order under PCP if not done.  Medicare Wellness Patient Questionnaire:  Answer theses question about your habits: Do you drink alcohol? No  If yes, how many drinks do you have a day?0 Have you ever smoked? Yes Quit date if applicable? 1950's  How many packs a day do/did you smoke? 3 cigarettes a day Do you use smokeless tobacco? No Do you use an illicit drugs? No Do you exercises? Yes IF so, what type and how many days/minutes per week? Walking within his apartment. Reports uses walker and walks more than 30 minutes per day.  Are you sexually active? N/A Number of partners? 0 Typical breakfast bacon and eggs Typical lunch whatever meal is provided at the ALF Typical dinner whatever meal is provided at the ALF Typical snacks: popcorn, donuts, cinnamon buns,   Beverages: lemonade and water  Answer theses question about you: Can you perform most household chores? No, it  Do you find it hard to follow a conversation in a noisy room? No Do you often ask people to speak up or repeat themselves? No Do you feel that you have a problem with memory? Yes, sometimes Do you balance your checkbook and or bank acounts?Yes Do you feel safe at home? Yes Last dentist visit? N/A, wears dentures Do you need assistance with any of the following: Please note if so Yes  Driving? Yes, son-in-law drives  Feeding yourself? No  Getting from bed to chair? No  Getting to the  toilet? No  Bathing or showering? Yes  Dressing yourself? No  Managing money? No  Climbing a flight of stairs No  Preparing meals? Yes, lives in assisting facility    Do you have Advanced Directives in place (Living Will, Healthcare Power or [place])? Currently has living will, working on getting HOA   Last eye Exam and location? 1 year ago, [address] [place]   Do you currently use prescribed or non-prescribed narcotic or opioid pain medications? Yes, Hydrocodone  Do you have a history or close family history of breast, ovarian, tubal or peritoneal cancer or a family member with BRCA (breast cancer susceptibility 1 and 2) gene mutations? No, not that patient is aware of  Nurse/Assistant Credentials/time stamp: BDS 05/30/23 4:09pm   ----------------------------------------------------------------------------------------------------------------------------------------------------------------------------------------------------------------------    MEDICARE ANNUAL PREVENTIVE CARE VISIT WITH PROVIDER (Welcome to Medicare, initial annual wellness or annual wellness exam)  Virtual Visit via Phone Note  I connected with Jonathon Martin on 05/30/23  by phone and verified that I am speaking with the correct person using two identifiers.  Location patient: home Location provider:work or home office Persons participating in the virtual visit: patient, provider  Concerns and/or follow up today: denies any concerns. Reports is doing well.    See HM section in Epic for other details of completed HM.    ROS: negative for report of fevers, unintentional weight loss, vision changes, vision loss, hearing loss or change, chest pain, sob, hemoptysis, melena, hematochezia, hematuria, falls, bleeding or bruising, thoughts of suicide or self  harm, memory loss  Patient-completed extensive health risk assessment - reviewed and discussed with the patient: See Health Risk Assessment  completed with patient prior to the visit either above or in recent phone note. This was reviewed in detailed with the patient today and appropriate recommendations, orders and referrals were placed as needed per Summary below and patient instructions.   Review of Medical History: -PMH, PSH, Family History and current specialty and care providers reviewed and updated and listed below   Patient Care Team: Karie Georges, MD as PCP - General (Family Medicine)   Past Medical History:  Diagnosis Date   Chronic back pain    Chronic leg pain     Past Surgical History:  Procedure Laterality Date   leg  1962   right leg reconstruction    Social History   Socioeconomic History   Marital status: Married    Spouse name: Not on file   Number of children: Not on file   Years of education: Not on file   Highest education level: 11th grade  Occupational History   Not on file  Tobacco Use   Smoking status: Former    Types: Cigars   Smokeless tobacco: Never  Vaping Use   Vaping Use: Never used  Substance and Sexual Activity   Alcohol use: Not Currently   Drug use: Never   Sexual activity: Not Currently  Other Topics Concern   Not on file  Social History Narrative   Not on file   Social Determinants of Health   Financial Resource Strain: Low Risk  (04/27/2023)   Overall Financial Resource Strain (CARDIA)    Difficulty of Paying Living Expenses: Not hard at all  Food Insecurity: No Food Insecurity (04/27/2023)   Hunger Vital Sign    Worried About Running Out of Food in the Last Year: Never true    Ran Out of Food in the Last Year: Never true  Transportation Needs: No Transportation Needs (04/27/2023)   PRAPARE - Administrator, Civil Service (Medical): No    Lack of Transportation (Non-Medical): No  Physical Activity: Unknown (04/27/2023)   Exercise Vital Sign    Days of Exercise per Week: 0 days    Minutes of Exercise per Session: Not on file  Recent Concern:  Physical Activity - Inactive (04/27/2023)   Exercise Vital Sign    Days of Exercise per Week: 0 days    Minutes of Exercise per Session: 30 min  Stress: No Stress Concern Present (04/27/2023)   [employer] of Occupational Health - Occupational Stress Questionnaire    Feeling of Stress : Not at all  Social Connections: Moderately Integrated (04/27/2023)   Social Connection and Isolation Panel [NHANES]    Frequency of Communication with Friends and Family: More than three times a week    Frequency of Social Gatherings with Friends and Family: More than three times a week    Attends Religious Services: 1 to 4 times per year    Active Member of [employer] or Organizations: No    Attends Banker Meetings: Not on file    Marital Status: Married  Catering manager Violence: Not At Risk (05/19/2022)   Humiliation, Afraid, Rape, and Kick questionnaire    Fear of Current or Ex-Partner: No    Emotionally Abused: No    Physically Abused: No    Sexually Abused: No    Family History  Problem Relation Age of Onset   Stroke Mother 20   Epilepsy  Sister    Colon cancer Neg Hx    Stomach cancer Neg Hx    Pancreatic cancer Neg Hx    Esophageal cancer Neg Hx    Liver disease Neg Hx    Rectal cancer Neg Hx     Current Outpatient Medications on File Prior to Visit  Medication Sig Dispense Refill   AMBULATORY NON FORMULARY MEDICATION Rollator 1 Units 0   AMBULATORY NON FORMULARY MEDICATION Left AFO 1 Units 0   aspirin EC 81 MG tablet Take 81 mg by mouth daily. Swallow whole.     atorvastatin (LIPITOR) 20 MG tablet Take 1 tablet (20 mg total) by mouth daily. 90 tablet 3   bisacodyl (DULCOLAX) 5 MG EC tablet Take 5 mg by mouth daily as needed for moderate constipation. Tues and fri to stay regular     Calcium Polycarbophil (FIBER-CAPS PO) Take by mouth.     donepezil (ARICEPT) 10 MG tablet Take 1 tablet (10 mg total) by mouth at bedtime. 90 tablet 3   esomeprazole (NEXIUM) 40 MG capsule  TAKE 1 CAPSULE BY MOUTH TWICE DAILY BEFORE A MEAL 180 capsule 3   HYDROcodone-acetaminophen (NORCO/VICODIN) 5-325 MG tablet Take 1 tablet by mouth every 6 (six) hours as needed for moderate pain. 120 tablet 0   [START ON 06/26/2023] HYDROcodone-acetaminophen (NORCO/VICODIN) 5-325 MG tablet Take 1 tablet by mouth every 6 (six) hours as needed for moderate pain. 120 tablet 0   HYDROcodone-acetaminophen (NORCO/VICODIN) 5-325 MG tablet Take 1 tablet at 8 am, 1 tablet at 12 pm, and 2 tablets at 8 pm 120 tablet 0   lisinopril (ZESTRIL) 40 MG tablet Take 1 tablet (40 mg total) by mouth daily. 90 tablet 3   mirtazapine (REMERON) 15 MG tablet TAKE 1 TABLET BY MOUTH AT BEDTIME. NEED APPOINTMENT WITH NEW PCP 90 tablet 1   Multiple Vitamin (MULTIVITAMIN) capsule Take 1 capsule by mouth daily.     No current facility-administered medications on file prior to visit.    No Known Allergies     Physical Exam There were no vitals filed for this visit. Estimated body mass index is 20.53 kg/m as calculated from the following:   Height as of 04/28/23: 5\' 8"  (1.727 m).   Weight as of 08/10/22: 135 lb (61.2 kg).  EKG (optional): deferred due to virtual visit  GENERAL: alert, oriented, no acute distress detected; full vision exam deferred due to pandemic and/or virtual encounter  PSYCH/NEURO: pleasant and cooperative, no obvious depression or anxiety, speech and thought processing grossly intact, Cognitive function grossly intact        05/30/2023    4:07 PM 05/19/2022    1:34 PM 04/27/2021    8:16 AM 09/04/2020    2:37 PM 02/11/2019    9:18 AM  Depression screen PHQ 2/9  Decreased Interest 0 0 0 0 0  Down, Depressed, Hopeless 0 0 0 0 0  PHQ - 2 Score 0 0 0 0 0       04/27/2021    8:20 AM 05/19/2022    1:36 PM 08/10/2022   12:33 PM 04/27/2023    9:19 PM 05/30/2023    4:07 PM  Fall Risk  Falls in the past year? 0 0  0 0  Was there an injury with Fall? 0 0   0  Fall Risk Category Calculator 0 0   0  Fall  Risk Category (Retired) Low Low     (RETIRED) Patient Fall Risk Level  Low fall risk Low  fall risk    Patient at Risk for Falls Due to Impaired vision;Impaired balance/gait;Impaired mobility No Fall Risks   No Fall Risks  Patient at Risk for Falls Due to - Comments related to back      Fall risk Follow up Falls prevention discussed    Falls evaluation completed     SUMMARY AND PLAN:  Encounter for Medicare annual wellness exam   Discussed applicable health maintenance/preventive health measures and advised and referred or ordered per patient preferences: -reviewed recommendations for the shingles and covid vaccines - they report they plan to get both at the pharmacy. Advised to let us know when they do so that we can update his chart  Health Maintenance  Topic Date Due   COVID-19 Vaccine (3 - 2023-24 season) 06/15/2023 (Originally 08/12/2022)   Zoster Vaccines- Shingrix (1 of 2) 08/30/2023 (Originally 03/17/1984)   INFLUENZA VACCINE  07/13/2023   Medicare Annual Wellness (AWV)  05/29/2024   DTaP/Tdap/Td (2 - Td or Tdap) 10/27/2025   Pneumonia Vaccine 13+ Years old  Completed   HPV VACCINES  Aged Out    Education and counseling on the following was provided based on the above review of health and a plan/checklist for the patient, along with additional information discussed, was provided for the patient in the patient instructions :  -Advised on importance of completing advanced directives, discussed options for completing and provided information in patient instructions as well -Advised and counseled on a healthy lifestyle - including the importance of a healthy diet, regular physical activity, social connections and stress management. -Reviewed patient's current diet. Advised and counseled on a whole foods based healthy diet. A summary of a healthy diet was provided in the Patient Instructions.  -reviewed patient's current physical activity level and discussed exercise guidelines for  adults. Discussed community resources and ideas for safe exercise at home to assist in meeting exercise guideline recommendations in a safe and healthy way.  -reviewed opiod use, no concerns, denies any side effects, advised to follow PCP instructions and to use lowest dose possible -Advise yearly dental visits at minimum and regular eye exams   Follow up: see patient instructions   Patient Instructions  I really enjoyed getting to talk with you today! I am available on Tuesdays and Thursdays for virtual visits if you have any questions or concerns, or if I can be of any further assistance.   CHECKLIST FROM ANNUAL WELLNESS VISIT:  -Follow up (please call to schedule if not scheduled after visit):   -yearly for annual wellness visit with primary care office  Here is a list of your preventive care/health maintenance measures and the plan for each if any are due:  PLAN For any measures below that may be due:   Health Maintenance  Topic Date Due   COVID-19 Vaccine (3 - 2023-24 season) 06/15/2023 (Originally 08/12/2022)   Zoster Vaccines- Shingrix (1 of 2) 08/30/2023 (Originally 03/17/1984)   INFLUENZA VACCINE  07/13/2023   Medicare Annual Wellness (AWV)  05/29/2024   DTaP/Tdap/Td (2 - Td or Tdap) 10/27/2025   Pneumonia Vaccine 51+ Years old  Completed   HPV VACCINES  Aged Out    -See a dentist at least yearly  -Get your eyes checked and then per your eye specialist's recommendations  -Other issues addressed today:   -I have included below further information regarding a healthy whole foods based diet, physical activity guidelines for adults, stress management and opportunities for social connections. I hope you find this information useful.    -----------------------------------------------------------------------------------------------------------------------------------------------------------------------------------------------------------------------------------------------------------  NUTRITION: -eat real food: lots of colorful vegetables (half the plate) and fruits -5-7 servings of vegetables and fruits per day (fresh or steamed is best), exp. 2 servings of vegetables with lunch and dinner and 2 servings of fruit per day. Berries and greens such as kale and collards are great choices.  -consume on a regular basis: whole grains (make sure first ingredient on label contains the word "whole"), fresh fruits, fish, nuts, seeds, healthy oils (such as olive oil, avocado oil, grape seed oil) -may eat small amounts of dairy and lean meat on occasion, but avoid processed meats such as ham, bacon, lunch meat, etc. -drink water -try to avoid fast food and pre-packaged foods, processed meat -most experts advise limiting sodium to < 2300mg  per day, should limit further is any chronic conditions such as high blood pressure, heart disease, diabetes, etc. The American Heart Association advised that < 1500mg  is is ideal -try to avoid foods that contain any ingredients with names you do not recognize  -try to avoid sugar/sweets (except for the natural sugar that occurs in fresh fruit) -try to avoid sweet drinks -try to avoid white rice, white bread, pasta (unless whole grain), white or yellow potatoes  EXERCISE GUIDELINES FOR ADULTS: -if you wish to increase your physical activity, do so gradually and with the approval of your doctor -STOP and seek medical care immediately if you have any chest pain, chest discomfort or trouble breathing when starting or increasing exercise  -move and stretch your body, legs, feet and arms when sitting for long periods -Physical activity guidelines for optimal health in adults: -least 150 minutes per week of  aerobic exercise (can talk, but not sing) once approved by your doctor, 20-30 minutes of sustained activity or two 10 minute episodes of sustained activity every day.  -resistance training at least 2 days per week if approved by your doctor -balance exercises 3+ days per week:   Stand somewhere where you have something sturdy to hold onto if you lose balance.    1) lift up on toes, start with 5x per day and work up to 20x   2) stand and lift on leg straight out to the side so that foot is a few inches of the floor, start with 5x each side and work up to 20x each side   3) stand on one foot, start with 5 seconds each side and work up to 20 seconds on each side  If you need ideas or help with getting more active:  -Silver sneakers https://tools.silversneakers.com  -Walk with a Doc: http://www.duncan-williams.com/  -try to include resistance (weight lifting/strength building) and balance exercises twice per week: or the following link for ideas: http://castillo-powell.com/  BuyDucts.dk  STRESS MANAGEMENT: -can try meditating, or just sitting quietly with deep breathing while intentionally relaxing all parts of your body for 5 minutes daily -if you need further help with stress, anxiety or depression please follow up with your primary doctor or contact the wonderful folks at [facility]: [phone]  SOCIAL CONNECTIONS: -options in [place] if you wish to engage in more social and exercise related activities:  -Silver sneakers https://tools.silversneakers.com  -Walk with a Doc: http://www.duncan-williams.com/  -Check out the [facility] Active Adults 50+ section on the [place] of [employer] (hiking clubs, book clubs, cards and games, chess, exercise classes, aquatic classes and much more) - see the website for  details: https://www.[place].gov/departments/parks-recreation/active-adults50  -YouTube has lots of exercise videos for different ages and abilities as well  [facility] (a variety of indoor and outdoor inperson activities for adults). [phone]. [address]  -Virtual Online Classes (a variety of topics): see seniorplanet.org or call [phone]  -consider volunteering at a school, hospice center, church, senior center or elsewhere           Terressa Koyanagi, DO

## 2023-06-02 ENCOUNTER — Telehealth: Payer: Self-pay | Admitting: Family Medicine

## 2023-06-02 NOTE — Telephone Encounter (Signed)
Aid and Attendance Application to be filled out--placed in dr's folder.  Call JV Antrum upon completion.  *Forms are in the same folder for pt and wife

## 2023-06-07 DIAGNOSIS — Z0279 Encounter for issue of other medical certificate: Secondary | ICD-10-CM

## 2023-06-07 NOTE — Telephone Encounter (Signed)
PCP completed the forms, charged a $50 fee and these were left at the front desk for pick up.  I left a detailed message with this information on the son-in-law's voicemail.   

## 2023-06-26 ENCOUNTER — Telehealth: Payer: Self-pay | Admitting: Family Medicine

## 2023-06-26 DIAGNOSIS — G8929 Other chronic pain: Secondary | ICD-10-CM

## 2023-06-26 NOTE — Telephone Encounter (Signed)
Boykin Peek - Resident Care Giver [phone]  Requesting a hard script for HYDROcodone-acetaminophen HYDROcodone-acetaminophen (NORCO/VICODIN) 5-325 MG tablet (the scheduled one)   [facility] - [place], [place] - [address] Phone: [phone]  Fax: [phone]

## 2023-06-27 MED ORDER — HYDROCODONE-ACETAMINOPHEN 5-325 MG PO TABS: ORAL_TABLET | ORAL | 0 refills | Status: DC

## 2023-06-27 NOTE — Telephone Encounter (Signed)
Spoke with [facility] and she stated the facility requires a written prescription for each Rx the patient will have every month be sent to fax#[phone] and [facility] will send the medication to them.  Message sent to PCP.

## 2023-06-27 NOTE — Telephone Encounter (Signed)
Does that mean they want it printed?

## 2023-07-03 NOTE — Telephone Encounter (Signed)
I attempted to fax the Rx a number of times on 7/16 and 7/17 to [phone] (per Magda Paganini) and [phone] as requested below with a response of "no answer".  Rx faxed to [facility] as below and confirmation was received.  Rx sent to be scanned.

## 2023-07-03 NOTE — Telephone Encounter (Signed)
[facility] called to ask if we could Please send the written script directly to the pharmacy, via fax to:  [facility] - [place], [place] - [address] Phone: [phone]  Fax: [phone]

## 2023-07-26 ENCOUNTER — Other Ambulatory Visit: Payer: Self-pay | Admitting: Family Medicine

## 2023-07-26 DIAGNOSIS — E785 Hyperlipidemia, unspecified: Secondary | ICD-10-CM

## 2023-07-31 ENCOUNTER — Telehealth: Payer: Self-pay | Admitting: Family Medicine

## 2023-07-31 DIAGNOSIS — G8929 Other chronic pain: Secondary | ICD-10-CM

## 2023-07-31 MED ORDER — HYDROCODONE-ACETAMINOPHEN 5-325 MG PO TABS: ORAL_TABLET | ORAL | 0 refills | Status: DC

## 2023-07-31 NOTE — Telephone Encounter (Signed)
Noted  

## 2023-07-31 NOTE — Telephone Encounter (Signed)
Prescription Request  07/31/2023  LOV: 04/28/2023  What is the name of the medication or equipment? HYDROcodone-acetaminophen (NORCO/VICODIN) 5-325 MG tablet   Have you contacted your pharmacy to request a refill? No   Which pharmacy would you like this sent to?   [facility] - [place], [place] - [address] [place] [address] Phone: [phone] Fax: [phone]    Patient notified that their request is being sent to the clinical staff for review and that they should receive a response within 2 business days.   Please advise at Mobile [phone] (mobile)

## 2023-08-28 ENCOUNTER — Telehealth: Payer: Self-pay | Admitting: Family Medicine

## 2023-08-28 DIAGNOSIS — G8929 Other chronic pain: Secondary | ICD-10-CM

## 2023-08-28 DIAGNOSIS — M545 Low back pain, unspecified: Secondary | ICD-10-CM

## 2023-08-28 NOTE — Telephone Encounter (Signed)
Melody Comas Med Tech at [facility] in [place] called pt needs:  HYDROcodone-acetaminophen (NORCO/VICODIN) 5-325 MG tablet   Sent to:  [facility] - [place], [place] - [address] Phone: [phone]  Fax: [phone]

## 2023-08-30 MED ORDER — HYDROCODONE-ACETAMINOPHEN 5-325 MG PO TABS: ORAL_TABLET | ORAL | 0 refills | Status: DC

## 2023-08-30 NOTE — Telephone Encounter (Signed)
Patient informed of the message below.  Per patient's request, I spoke with his son-in-law Mr Antrum and scheduled a visit on 11/5.

## 2023-08-30 NOTE — Telephone Encounter (Signed)
Script sent-- I do not see a follow up visit in the chart-- he will need a follow up in November to continue the refills.[place] KitchenMarland KitchenMarland Kitchen

## 2023-09-26 ENCOUNTER — Telehealth: Payer: Self-pay | Admitting: Family Medicine

## 2023-09-26 DIAGNOSIS — G8929 Other chronic pain: Secondary | ICD-10-CM

## 2023-09-26 MED ORDER — HYDROCODONE-ACETAMINOPHEN 5-325 MG PO TABS: ORAL_TABLET | ORAL | 0 refills | Status: DC

## 2023-09-26 NOTE — Telephone Encounter (Signed)
Rx sent 

## 2023-09-26 NOTE — Telephone Encounter (Signed)
Prescription Request  09/26/2023  LOV: 04/28/2023  What is the name of the medication or equipment? HYDROcodone-acetaminophen (NORCO/VICODIN) 5-325 MG tablet   Have you contacted your pharmacy to request a refill? No   Which pharmacy would you like this sent to?   [facility] - [place], [place] - [address] [place] [address] Phone: [phone] Fax: [phone]    Patient notified that their request is being sent to the clinical staff for review and that they should receive a response within 2 business days.   Please advise at Mobile [phone] (mobile)

## 2023-10-17 ENCOUNTER — Ambulatory Visit: Payer: Medicare Other | Admitting: Family Medicine

## 2023-10-17 ENCOUNTER — Encounter: Payer: Self-pay | Admitting: Family Medicine

## 2023-10-17 DIAGNOSIS — M545 Low back pain, unspecified: Secondary | ICD-10-CM

## 2023-10-17 DIAGNOSIS — I10 Essential (primary) hypertension: Secondary | ICD-10-CM

## 2023-10-17 DIAGNOSIS — G4700 Insomnia, unspecified: Secondary | ICD-10-CM | POA: Diagnosis not present

## 2023-10-17 DIAGNOSIS — E785 Hyperlipidemia, unspecified: Secondary | ICD-10-CM | POA: Diagnosis not present

## 2023-10-17 DIAGNOSIS — G8929 Other chronic pain: Secondary | ICD-10-CM | POA: Diagnosis not present

## 2023-10-17 DIAGNOSIS — F03B0 Unspecified dementia, moderate, without behavioral disturbance, psychotic disturbance, mood disturbance, and anxiety: Secondary | ICD-10-CM

## 2023-10-17 MED ORDER — DONEPEZIL HCL 10 MG PO TABS
10.0000 mg | ORAL_TABLET | Freq: Every day | ORAL | 3 refills | Status: DC
Start: 2023-10-17 — End: 2024-10-04

## 2023-10-17 MED ORDER — ATORVASTATIN CALCIUM 20 MG PO TABS: 20.0000 mg | ORAL_TABLET | Freq: Every day | ORAL | 0 refills | Status: DC

## 2023-10-17 MED ORDER — MIRTAZAPINE 15 MG PO TABS: ORAL_TABLET | ORAL | 1 refills | Status: DC

## 2023-10-17 MED ORDER — LISINOPRIL 40 MG PO TABS: 40.0000 mg | ORAL_TABLET | Freq: Every day | ORAL | 3 refills | Status: DC

## 2023-10-17 MED ORDER — HYDROCODONE-ACETAMINOPHEN 5-325 MG PO TABS: ORAL_TABLET | ORAL | 0 refills | Status: DC

## 2023-10-17 NOTE — Progress Notes (Unsigned)
Established Patient Office Visit  Subjective   Patient ID: Jonathon Martin, male    DOB: 04-25-1934  Age: 89 y.o. MRN: [id number]  Chief Complaint  Patient presents with  . Medical Management of Chronic Issues    Pt is here for medications reflils on his norco today. He reports that he is the same as the previous visit, no new symptoms or changes in his health status.   Chronic pain symptoms-- pt reports no changes in his pain level. States that the medication continues to work well for him. Son in law is in the visit with him and he reports he is still walking about the same.    Current Outpatient Medications  Medication Instructions  . AMBULATORY NON FORMULARY MEDICATION Rollator  . AMBULATORY NON FORMULARY MEDICATION Left AFO  . aspirin EC 81 mg, Oral, Daily, Swallow whole.  Marland Kitchen atorvastatin (LIPITOR) 20 mg, Oral, Daily  . bisacodyl (DULCOLAX) 5 mg, Oral, Daily PRN, Tues and fri to stay regular  . Calcium Polycarbophil (FIBER-CAPS PO) Oral  . donepezil (ARICEPT) 10 mg, Oral, Daily at bedtime  . esomeprazole (NEXIUM) 40 MG capsule TAKE 1 CAPSULE BY MOUTH TWICE DAILY BEFORE A MEAL  . HYDROcodone-acetaminophen (NORCO/VICODIN) 5-325 MG tablet Take 1 tablet at 8 am, 1 tablet at 12 pm, and 2 tablets at 8 pm  . lisinopril (ZESTRIL) 40 mg, Oral, Daily  . mirtazapine (REMERON) 15 MG tablet TAKE 1 TABLET BY MOUTH AT BEDTIME.  . Multiple Vitamin (MULTIVITAMIN) capsule 1 capsule, Oral, Daily       Review of Systems  All other systems reviewed and are negative.     Objective:     BP 120/60 (BP Location: Left Arm, Patient Position: Sitting, Cuff Size: Normal)   Pulse 85   Temp 97.7 F (36.5 C) (Oral)   Ht 5\' 8"  (1.727 m)   SpO2 96%   BMI 20.53 kg/m  {Vitals History (Optional):23777}  Physical Exam Vitals reviewed.  Constitutional:      Appearance: Normal appearance. He is well-groomed and normal weight.  Eyes:     Extraocular Movements: Extraocular movements intact.      Conjunctiva/sclera: Conjunctivae normal.  Neck:     Thyroid: No thyromegaly.  Cardiovascular:     Rate and Rhythm: Normal rate and regular rhythm.     Heart sounds: S1 normal and S2 normal. No murmur heard. Pulmonary:     Effort: Pulmonary effort is normal.     Breath sounds: Normal breath sounds and air entry. No rales.  Abdominal:     General: Abdomen is flat. Bowel sounds are normal.  Musculoskeletal:     Right lower leg: No edema.     Left lower leg: No edema.  Neurological:     General: No focal deficit present.     Mental Status: He is alert and oriented to person, place, and time.     Gait: Gait is intact.  Psychiatric:        Mood and Affect: Mood and affect normal.     No results found for any visits on 10/17/23.  {Labs (Optional):23779}  The ASCVD Risk score (Arnett DK, et al., 2019) failed to calculate for the following reasons:   The 2019 ASCVD risk score is only valid for ages 56 to 66    Assessment & Plan:  Hyperlipidemia, unspecified hyperlipidemia type -     Atorvastatin Calcium; Take 1 tablet (20 mg total) by mouth daily.  Dispense: 90 tablet; Refill: 0  Moderate  dementia without behavioral disturbance, psychotic disturbance, mood disturbance, or anxiety, unspecified dementia type (HCC) -     Donepezil HCl; Take 1 tablet (10 mg total) by mouth at bedtime.  Dispense: 90 tablet; Refill: 3  Primary hypertension -     Lisinopril; Take 1 tablet (40 mg total) by mouth daily.  Dispense: 90 tablet; Refill: 3  Insomnia, unspecified type -     Mirtazapine; TAKE 1 TABLET BY MOUTH AT BEDTIME.  Dispense: 90 tablet; Refill: 1  Chronic midline low back pain without sciatica -     HYDROcodone-Acetaminophen; Take 1 tablet at 8 am, 1 tablet at 12 pm, and 2 tablets at 8 pm  Dispense: 120 tablet; Refill: 0     No follow-ups on file.    Karie Georges, MD

## 2023-10-18 NOTE — Assessment & Plan Note (Signed)
Chronic, stable symptoms on the current medications. We reviewed the risks/ benefits of taking chronic long term opioids, patient has chronic back pain from a remote injury and has had extensive right leg surgeries in the past. He cannot take NSAIDS due to CKD stage 3. He is no longer taking the gabapentin due to ineffectiveness. Due to transportation/mobility issues will continue visit every 6 months. Continue medication as prescribed.

## 2023-10-18 NOTE — Assessment & Plan Note (Signed)
On aricept 10 mg daily, son in law reports that he is at his baseline mental status, no deterioration since the last visit, will continue medication as prescribed.

## 2023-10-18 NOTE — Assessment & Plan Note (Signed)
Denies side effects, needs refills today

## 2023-10-18 NOTE — Assessment & Plan Note (Signed)
BP is chronic, stable, will continue 40 mg daily of lisinopril

## 2023-11-27 ENCOUNTER — Telehealth: Payer: Self-pay | Admitting: Family Medicine

## 2023-11-27 NOTE — Telephone Encounter (Signed)
[facility] - Resident Care Giver [phone]   Requesting a "hard script" for HYDROcodone-acetaminophen HYDROcodone-acetaminophen (NORCO/VICODIN) 5-325 MG tablet (the scheduled one)    [facility] - [place], [place] - [address] Phone: [phone]  Fax: [phone]

## 2023-11-28 ENCOUNTER — Other Ambulatory Visit: Payer: Self-pay | Admitting: Family Medicine

## 2023-11-28 DIAGNOSIS — G8929 Other chronic pain: Secondary | ICD-10-CM

## 2023-11-28 MED ORDER — HYDROCODONE-ACETAMINOPHEN 5-325 MG PO TABS: ORAL_TABLET | ORAL | 0 refills | Status: DC

## 2023-11-28 NOTE — Telephone Encounter (Signed)
Script sent  

## 2023-12-25 ENCOUNTER — Other Ambulatory Visit: Payer: Self-pay | Admitting: Family Medicine

## 2023-12-25 DIAGNOSIS — G8929 Other chronic pain: Secondary | ICD-10-CM

## 2023-12-27 ENCOUNTER — Other Ambulatory Visit: Payer: Self-pay | Admitting: Family Medicine

## 2023-12-27 DIAGNOSIS — M545 Low back pain, unspecified: Secondary | ICD-10-CM

## 2023-12-27 NOTE — Telephone Encounter (Signed)
 Copied from CRM [id number]. Topic: Clinical - Medication Refill >> Dec 27, 2023  2:47 PM Aline Ireland wrote: Most Recent Primary Care Visit:  Provider: Aida House  Department: [facility]  Visit Type: OFFICE VISIT  Date: 10/17/2023  Medication: HYDROcodone -acetaminophen  (NORCO/VICODIN) 5-325  Has the patient contacted their pharmacy? Yes (Agent: If no, request that the patient contact the pharmacy for the refill. If patient does not wish to contact the pharmacy document the reason why and proceed with request.) (Agent: If yes, when and what did the pharmacy advise?) Says a hard script always have to be sent from the office first before the medication can be refilled  Is this the correct pharmacy for this prescription? Yes If no, delete pharmacy and type the correct one.  This is the patient's preferred pharmacy:  [facility] - [place], [place] - [phone] [address] [place] [address] Phone: [phone] Fax: [phone]   Has the prescription been filled recently? No  Is the patient out of the medication? No  Has the patient been seen for an appointment in the last year OR does the patient have an upcoming appointment? Yes  Can we respond through MyChart? No  Agent: Please be advised that Rx refills may take up to 3 business days. We ask that you follow-up with your pharmacy.

## 2023-12-27 NOTE — Telephone Encounter (Signed)
 I just sent it yesterday-- did they receive the script?

## 2023-12-28 NOTE — Telephone Encounter (Signed)
Spoke with Mardelle Matte, pharmacist at the [facility] location,  he stated he found the Rx and will fill for the patient.  Message sent to PCP to sent denial due to controlled Rx.

## 2024-01-23 ENCOUNTER — Other Ambulatory Visit: Payer: Self-pay | Admitting: Family Medicine

## 2024-01-23 DIAGNOSIS — M545 Low back pain, unspecified: Secondary | ICD-10-CM

## 2024-01-23 MED ORDER — HYDROCODONE-ACETAMINOPHEN 5-325 MG PO TABS: ORAL_TABLET | ORAL | 0 refills | Status: DC

## 2024-01-23 NOTE — Telephone Encounter (Signed)
Copied from CRM [id number]. Topic: Clinical - Medication Refill >> Jan 23, 2024  3:28 PM Isabell A wrote: Most Recent Primary Care Visit:  Provider: Karie Georges  Department: [facility]  Visit Type: OFFICE VISIT  Date: 10/17/2023  Medication: HYDROcodone-acetaminophen (NORCO/VICODIN) 5-325 MG tablet  Has the patient contacted their pharmacy? No (Agent: If no, request that the patient contact the pharmacy for the refill. If patient does not wish to contact the pharmacy document the reason why and proceed with request.) (Agent: If yes, when and what did the pharmacy advise?)  Is this the correct pharmacy for this prescription? Yes If no, delete pharmacy and type the correct one.  This is the patient's preferred pharmacy:  [facility] - [place], [place] - [phone] [place] [address] Phone: [phone] Fax: [phone]   Has the prescription been filled recently? Yes  Is the patient out of the medication? No  Has the patient been seen for an appointment in the last year OR does the patient have an upcoming appointment? Yes  Can we respond through MyChart? No  Agent: Please be advised that Rx refills may take up to 3 business days. We ask that you follow-up with your pharmacy.

## 2024-01-23 NOTE — Telephone Encounter (Signed)
Copied from CRM [id number]. Topic: Clinical - Medication Refill >> Jan 23, 2024  3:28 PM Isabell A wrote: Most Recent Primary Care Visit:  Provider: Karie Georges  Department: [facility]  Visit Type: OFFICE VISIT  Date: 10/17/2023  Medication: HYDROcodone-acetaminophen (NORCO/VICODIN) 5-325 MG tablet  Has the patient contacted their pharmacy? No (Agent: If no, request that the patient contact the pharmacy for the refill. If patient does not wish to contact the pharmacy document the reason why and proceed with request.) (Agent: If yes, when and what did the pharmacy advise?)  Is this the correct pharmacy for this prescription?  If no, delete pharmacy and type the correct one.  This is the patient's preferred pharmacy:  [facility] - [place], [place] - [phone] [address] [place] [address] Phone: [phone] Fax: [phone]   Has the prescription been filled recently? Yes  Is the patient out of the medication? No  Has the patient been seen for an appointment in the last year OR does the patient have an upcoming appointment? Yes  Can we respond through MyChart? No  Agent: Please be advised that Rx refills may take up to 3 business days. We ask that you follow-up with your pharmacy.

## 2024-02-22 ENCOUNTER — Other Ambulatory Visit: Payer: Self-pay | Admitting: *Deleted

## 2024-02-22 DIAGNOSIS — M545 Low back pain, unspecified: Secondary | ICD-10-CM

## 2024-02-22 DIAGNOSIS — H52203 Unspecified astigmatism, bilateral: Secondary | ICD-10-CM | POA: Diagnosis not present

## 2024-02-22 DIAGNOSIS — H5203 Hypermetropia, bilateral: Secondary | ICD-10-CM | POA: Diagnosis not present

## 2024-02-22 DIAGNOSIS — H353131 Nonexudative age-related macular degeneration, bilateral, early dry stage: Secondary | ICD-10-CM | POA: Diagnosis not present

## 2024-02-22 DIAGNOSIS — G8929 Other chronic pain: Secondary | ICD-10-CM

## 2024-02-23 MED ORDER — HYDROCODONE-ACETAMINOPHEN 5-325 MG PO TABS: ORAL_TABLET | ORAL | 0 refills | Status: DC

## 2024-02-23 NOTE — Telephone Encounter (Signed)
 Pt doesn't have an appointment with me set up yet-- please call the family to make sure they get an appointment.

## 2024-02-26 NOTE — Telephone Encounter (Signed)
No answer at the patient's cell number. ?

## 2024-03-22 ENCOUNTER — Other Ambulatory Visit: Payer: Self-pay | Admitting: Family Medicine

## 2024-03-22 DIAGNOSIS — M545 Low back pain, unspecified: Secondary | ICD-10-CM

## 2024-03-22 NOTE — Telephone Encounter (Signed)
 Copied from CRM [id number]. Topic: Clinical - Medication Refill >> Mar 22, 2024  3:03 PM Drema Balzarine wrote: Most Recent Primary Care Visit:  Provider: Karie Georges  Department: [facility]  Visit Type: OFFICE VISIT  Date: 10/17/2023  Medication: HYDROcodone-acetaminophen (NORCO/VICODIN) 5-325 MG tablet   Has the patient contacted their pharmacy? Yes (Agent: If no, request that the patient contact the pharmacy for the refill. If patient does not wish to contact the pharmacy document the reason why and proceed with request.) (Agent: If yes, when and what did the pharmacy advise?)  Is this the correct pharmacy for this prescription? Yes If no, delete pharmacy and type the correct one.  This is the patient's preferred pharmacy:  [facility] - [place], [place] - [phone] [address] [place] [address] Phone: [phone] Fax: [phone]   Has the prescription been filled recently? Yes  Is the patient out of the medication? Yes  Has the patient been seen for an appointment in the last year OR does the patient have an upcoming appointment? Yes  Can we respond through MyChart? No  Agent: Please be advised that Rx refills may take up to 3 business days. We ask that you follow-up with your pharmacy.

## 2024-03-24 NOTE — Telephone Encounter (Signed)
 Pt does not have a follow up scheduled, I know his wife recently passed but he will need a follow up in the next month in order to continue his rx. Once the appt is schedule please send this back to me and I will fill enough until his appt.

## 2024-03-28 ENCOUNTER — Other Ambulatory Visit: Payer: Self-pay | Admitting: Family Medicine

## 2024-03-28 NOTE — Telephone Encounter (Signed)
 Okay for refill?

## 2024-03-28 NOTE — Telephone Encounter (Signed)
 Copied from CRM [id number]. Topic: Clinical - Medication Refill >> Mar 28, 2024 10:14 AM Ovid Blow wrote: Most Recent Primary Care Visit:  Provider: Aida House  Department: [facility]  Visit Type: OFFICE VISIT  Date: 10/17/2023  Medication: HYDROcodone-acetaminophen (NORCO/VICODIN) 5-325 MG tablet  Has the patient contacted their pharmacy? Yes (Agent: If no, request that the patient contact the pharmacy for the refill. If patient does not wish to contact the pharmacy document the reason why and proceed with request.) (Agent: If yes, when and what did the pharmacy advise?)  Is this the correct pharmacy for this prescription? Yes If no, delete pharmacy and type the correct one.  This is the patient's preferred pharmacy:  [facility] - [place], [place] - [phone] [address] [place] [address] Phone: [phone] Fax: [phone]   Has the prescription been filled recently? No  Is the patient out of the medication? Yes  Has the patient been seen for an appointment in the last year OR does the patient have an upcoming appointment? Yes  Can we respond through MyChart? Yes  Agent: Please be advised that Rx refills may take up to 3 business days. We ask that you follow-up with your pharmacy.

## 2024-03-30 ENCOUNTER — Other Ambulatory Visit: Payer: Self-pay | Admitting: Family Medicine

## 2024-03-30 DIAGNOSIS — M545 Low back pain, unspecified: Secondary | ICD-10-CM

## 2024-04-01 ENCOUNTER — Telehealth: Payer: Self-pay

## 2024-04-01 MED ORDER — HYDROCODONE-ACETAMINOPHEN 5-325 MG PO TABS: ORAL_TABLET | ORAL | 0 refills | Status: DC

## 2024-04-01 NOTE — Telephone Encounter (Signed)
 Rx sent electronically today

## 2024-04-01 NOTE — Telephone Encounter (Signed)
 Copied from CRM [id number]. Topic: Appointments - Appointment Scheduling >> Mar 28, 2024 10:09 AM Ovid Blow wrote: Patient/patient representative is calling to schedule an appointment. Patient is scheduled for follow up appt. Fax medication HYDROcodone -acetaminophen  (NORCO/VICODIN) 5-325 MG tablet order to [facility] @ [phone]

## 2024-04-04 ENCOUNTER — Ambulatory Visit: Admitting: Family Medicine

## 2024-04-04 ENCOUNTER — Encounter: Payer: Self-pay | Admitting: Family Medicine

## 2024-04-04 VITALS — BP 142/88 | HR 70 | Temp 97.8°F | Ht 68.0 in

## 2024-04-04 DIAGNOSIS — E785 Hyperlipidemia, unspecified: Secondary | ICD-10-CM | POA: Diagnosis not present

## 2024-04-04 DIAGNOSIS — I10 Essential (primary) hypertension: Secondary | ICD-10-CM | POA: Diagnosis not present

## 2024-04-04 DIAGNOSIS — M545 Low back pain, unspecified: Secondary | ICD-10-CM

## 2024-04-04 DIAGNOSIS — Z131 Encounter for screening for diabetes mellitus: Secondary | ICD-10-CM

## 2024-04-04 DIAGNOSIS — G4700 Insomnia, unspecified: Secondary | ICD-10-CM

## 2024-04-04 DIAGNOSIS — G8929 Other chronic pain: Secondary | ICD-10-CM

## 2024-04-04 LAB — LIPID PANEL
Cholesterol: 124 mg/dL (ref 0–200)
HDL: 43.5 mg/dL (ref >39.00)
LDL Cholesterol: 62 mg/dL (ref 0–99)
NonHDL: 80.98
Total CHOL/HDL Ratio: 3
Triglycerides: 96 mg/dL (ref 0.0–149.0)
VLDL: 19.2 mg/dL (ref 0.0–40.0)

## 2024-04-04 LAB — CBC WITH DIFFERENTIAL/PLATELET
Basophils Absolute: 0.1 10*3/uL (ref 0.0–0.1)
Basophils Relative: 0.7 % (ref 0.0–3.0)
Eosinophils Absolute: 0.4 10*3/uL (ref 0.0–0.7)
Eosinophils Relative: 4.3 % (ref 0.0–5.0)
HCT: 38 % — ABNORMAL LOW (ref 39.0–52.0)
Hemoglobin: 12.8 g/dL — ABNORMAL LOW (ref 13.0–17.0)
Lymphocytes Relative: 18 % (ref 12.0–46.0)
Lymphs Abs: 1.5 10*3/uL (ref 0.7–4.0)
MCHC: 33.6 g/dL (ref 30.0–36.0)
MCV: 96.9 fl (ref 78.0–100.0)
Monocytes Absolute: 0.6 10*3/uL (ref 0.1–1.0)
Monocytes Relative: 7.5 % (ref 3.0–12.0)
Neutro Abs: 5.7 10*3/uL (ref 1.4–7.7)
Neutrophils Relative %: 69.5 % (ref 43.0–77.0)
Platelets: 220 10*3/uL (ref 150.0–400.0)
RBC: 3.92 Mil/uL — ABNORMAL LOW (ref 4.22–5.81)
RDW: 12.6 % (ref 11.5–15.5)
WBC: 8.2 10*3/uL (ref 4.0–10.5)

## 2024-04-04 LAB — COMPREHENSIVE METABOLIC PANEL WITH GFR
ALT: 19 U/L (ref 0–53)
AST: 15 U/L (ref 0–37)
Albumin: 4 g/dL (ref 3.5–5.2)
Alkaline Phosphatase: 29 U/L — ABNORMAL LOW (ref 39–117)
BUN: 17 mg/dL (ref 6–23)
CO2: 28 meq/L (ref 19–32)
Calcium: 9.2 mg/dL (ref 8.4–10.5)
Chloride: 103 meq/L (ref 96–112)
Creatinine, Ser: 1.44 mg/dL (ref 0.40–1.50)
GFR: 43.52 mL/min — ABNORMAL LOW (ref >60.00)
Glucose, Bld: 114 mg/dL — ABNORMAL HIGH (ref 70–99)
Potassium: 4.1 meq/L (ref 3.5–5.1)
Sodium: 137 meq/L (ref 135–145)
Total Bilirubin: 0.4 mg/dL (ref 0.2–1.2)
Total Protein: 6.4 g/dL (ref 6.0–8.3)

## 2024-04-04 LAB — HEMOGLOBIN A1C: Hgb A1c MFr Bld: 5.8 % (ref 4.6–6.5)

## 2024-04-04 MED ORDER — ATORVASTATIN CALCIUM 20 MG PO TABS: 20.0000 mg | ORAL_TABLET | Freq: Every day | ORAL | 1 refills | Status: DC

## 2024-04-04 MED ORDER — MIRTAZAPINE 15 MG PO TABS: ORAL_TABLET | ORAL | 1 refills | Status: DC

## 2024-04-04 NOTE — Progress Notes (Signed)
 Established Patient Office Visit  Subjective   Patient ID: Jonathon Martin, male    DOB: Feb 03, 1934  Age: 89 y.o. MRN: [id number]  Chief Complaint  Patient presents with   Medical Management of Chronic Issues    Pt is here for follow up and medication refills today.   HTN -- BP in office performed and is not controlled today. His wife recently passes away after a severe illness and he reports feeling sad and somewhat anxious. He  reports no side effects to the medications, no chest pain, SOB, dizziness or headaches. He has a BP cuff at home and is checking BP regularly at the ALF that he lives in. States that his pain level is about the same while on the chronic pain medications. No changes otherwise.    Chronic back pain-- pt reports he cannot walk much -- states he uses an electric chair for most ambulation around the nursing facility. States that she doesn't feel any weaker, states that he is still independent with getting out of bed and going to the BR on his own, dressing himself.        Current Outpatient Medications  Medication Instructions   AMBULATORY NON FORMULARY MEDICATION Rollator   AMBULATORY NON FORMULARY MEDICATION Left AFO   aspirin EC 81 mg, Daily   atorvastatin  (LIPITOR) 20 mg, Oral, Daily   bisacodyl (DULCOLAX) 5 mg, Daily PRN   Calcium  Polycarbophil (FIBER-CAPS PO) Take by mouth.   donepezil  (ARICEPT ) 10 mg, Oral, Daily at bedtime   esomeprazole  (NEXIUM ) 40 MG capsule TAKE 1 CAPSULE BY MOUTH TWICE DAILY BEFORE A MEAL   HYDROcodone -acetaminophen  (NORCO/VICODIN) 5-325 MG tablet TAKE 1 TABLET BY MOUTH AT 8AM AND 12PM (CONTROL);TAKE 2 TABLETS (10/650MG ) BY MOUTH AT 8PM   lisinopril  (ZESTRIL ) 40 mg, Oral, Daily   mirtazapine  (REMERON ) 15 MG tablet TAKE 1 TABLET BY MOUTH AT BEDTIME.   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     Objective:     BP (!) 142/88 (BP Location: Left Arm, Patient Position: Sitting, Cuff Size: Normal)   Pulse 70   Temp 97.8 F (36.6 C) (Oral)   Ht 5\' 8"  (1.727 m)   SpO2 99%   BMI 20.53 kg/m    Physical Exam Vitals reviewed.  Constitutional:      Appearance: Normal appearance. He is well-groomed and underweight.     Comments: In wheelchair  Eyes:     Extraocular Movements: Extraocular movements intact.     Conjunctiva/sclera: Conjunctivae normal.  Neck:     Thyroid : No thyromegaly.  Cardiovascular:     Rate and Rhythm: Normal rate and regular rhythm.     Heart sounds: S1 normal and S2 normal. No murmur heard. Pulmonary:     Effort: Pulmonary effort is normal.     Breath sounds: Normal breath sounds and air entry. No rales.  Abdominal:     General:  Abdomen is flat. Bowel sounds are normal.  Musculoskeletal:     Right lower leg: No edema.     Left lower leg: No edema.  Neurological:     General: No focal deficit present.     Mental Status: He is alert and oriented to person, place, and time.     Gait: Gait is intact.  Psychiatric:        Mood and Affect: Mood and affect normal.      The ASCVD Risk score (Arnett DK, et al., 2019) failed to calculate for the following reasons:   The 2019 ASCVD risk score is only valid for ages 56 to 52    Assessment & Plan:  Primary hypertension Assessment & Plan: Chronic, slightly elevated today,. BP recheck was about the same. Pt is not  having any symtpoms of elevated BP, has had normal readings on the same medication in the past. I advised his son in law to continue checking BP at home and if it is high also there then they should contact me and we can discuss adjusting his medication, will check CBC, CMP for annual surveillance. Continue lisinopril  40 mg daily.   Orders: -     Comprehensive metabolic panel with GFR; Future -     CBC with Differential/Platelet; Future  Chronic midline low back pain without sciatica Assessment & Plan: Chronic, stable symptoms on the current medications. We reviewed the risks/ benefits of taking chronic long term opioids, patient has chronic back pain from a remote injury and has had extensive right leg surgeries in the past. He cannot take NSAIDS due to CKD stage 3. He is no longer taking the gabapentin  due to ineffectiveness. Due to transportation/mobility issues will continue visit every 6 months. Continue Norco 5/325 mg 4 tablets daily as prescribed.    Insomnia, unspecified type Assessment & Plan: Pt is stable on the mirtazapine , weight not obtained today due to being in wheelchair. Reports he is sleeping well with this medication. Continue as prescribed.   Orders: -     Mirtazapine ; TAKE 1 TABLET BY MOUTH AT BEDTIME.  Dispense: 90 tablet; Refill: 1  Hyperlipidemia, unspecified hyperlipidemia type Assessment & Plan: On atorvastatin  20 mg daily, needs new lipid panel and CMP today for annual surveillance.   Orders: -     Atorvastatin  Calcium ; Take 1 tablet (20 mg total) by mouth daily.  Dispense: 90 tablet; Refill: 1 -     Lipid panel; Future  Diabetes mellitus screening -     Hemoglobin A1c; Future     Return in about 4 months (around 08/04/2024) for medication refills.    Aida House, MD

## 2024-04-07 NOTE — Assessment & Plan Note (Signed)
 On atorvastatin  20 mg daily, needs new lipid panel and CMP today for annual surveillance.

## 2024-04-07 NOTE — Assessment & Plan Note (Signed)
 Chronic, slightly elevated today,. BP recheck was about the same. Pt is not having any symtpoms of elevated BP, has had normal readings on the same medication in the past. I advised his son in law to continue checking BP at home and if it is high also there then they should contact me and we can discuss adjusting his medication, will check CBC, CMP for annual surveillance. Continue lisinopril  40 mg daily.

## 2024-04-07 NOTE — Assessment & Plan Note (Signed)
 Pt is stable on the mirtazapine , weight not obtained today due to being in wheelchair. Reports he is sleeping well with this medication. Continue as prescribed.

## 2024-04-07 NOTE — Assessment & Plan Note (Signed)
 Chronic, stable symptoms on the current medications. We reviewed the risks/ benefits of taking chronic long term opioids, patient has chronic back pain from a remote injury and has had extensive right leg surgeries in the past. He cannot take NSAIDS due to CKD stage 3. He is no longer taking the gabapentin  due to ineffectiveness. Due to transportation/mobility issues will continue visit every 6 months. Continue Norco 5/325 mg 4 tablets daily as prescribed.

## 2024-04-25 ENCOUNTER — Other Ambulatory Visit: Payer: Self-pay | Admitting: Family Medicine

## 2024-04-25 DIAGNOSIS — G8929 Other chronic pain: Secondary | ICD-10-CM

## 2024-04-25 MED ORDER — HYDROCODONE-ACETAMINOPHEN 5-325 MG PO TABS: ORAL_TABLET | ORAL | 0 refills | Status: DC

## 2024-04-25 NOTE — Telephone Encounter (Unsigned)
 Copied from CRM [id number]. Topic: Clinical - Medication Refill >> Apr 25, 2024  2:48 PM Magdalene School wrote: Medication: HYDROcodone -acetaminophen  (NORCO/VICODIN) 5-325 MG tablet  Has the patient contacted their pharmacy? Yes (Agent: If no, request that the patient contact the pharmacy for the refill. If patient does not wish to contact the pharmacy document the reason why and proceed with request.) (Agent: If yes, when and what did the pharmacy advise?) no refills available.  This is the patient's preferred pharmacy:  [facility] - [place], [place] - [phone] [address] [place] [address] Phone: [phone] Fax: [phone]  Is this the correct pharmacy for this prescription? Yes If no, delete pharmacy and type the correct one.   Has the prescription been filled recently? No  Is the patient out of the medication? No  Has the patient been seen for an appointment in the last year OR does the patient have an upcoming appointment? Yes  Can we respond through MyChart? No  Agent: Please be advised that Rx refills may take up to 3 business days. We ask that you follow-up with your pharmacy.

## 2024-06-03 ENCOUNTER — Other Ambulatory Visit: Payer: Self-pay | Admitting: Family Medicine

## 2024-06-03 DIAGNOSIS — G8929 Other chronic pain: Secondary | ICD-10-CM

## 2024-06-03 MED ORDER — HYDROCODONE-ACETAMINOPHEN 5-325 MG PO TABS: ORAL_TABLET | ORAL | 0 refills | Status: DC

## 2024-06-03 NOTE — Addendum Note (Signed)
 Addended by: Gerene Nedd M on: 06/03/2024 01:57 PM   Modules accepted: Orders

## 2024-06-03 NOTE — Telephone Encounter (Signed)
 Copied from CRM [id number]. Topic: Clinical - Medication Refill >> Jun 03, 2024 12:44 PM Robinson H wrote: Medication: HYDROcodone -acetaminophen  (NORCO/VICODIN) 5-325 MG tablet  Has the patient contacted their pharmacy? No, facility calling [facility] (Agent: If no, request that the patient contact the pharmacy for the refill. If patient does not wish to contact the pharmacy document the reason why and proceed with request.) (Agent: If yes, when and what did the pharmacy advise?)  This is the patient's preferred pharmacy:  [facility] - [place], [place] - [address] [place] [address] Phone: [phone] Fax: [phone]  Is this the correct pharmacy for this prescription? Yes If no, delete pharmacy and type the correct one.   Has the prescription been filled recently? No  Is the patient out of the medication? Yes  Has the patient been seen for an appointment in the last year OR does the patient have an upcoming appointment? Yes  Can we respond through MyChart? No  Agent: Please be advised that Rx refills may take up to 3 business days. We ask that you follow-up with your pharmacy.

## 2024-07-03 ENCOUNTER — Other Ambulatory Visit: Payer: Self-pay | Admitting: Family Medicine

## 2024-07-03 DIAGNOSIS — M545 Low back pain, unspecified: Secondary | ICD-10-CM

## 2024-07-03 MED ORDER — HYDROCODONE-ACETAMINOPHEN 5-325 MG PO TABS: ORAL_TABLET | ORAL | 0 refills | Status: DC

## 2024-07-03 NOTE — Telephone Encounter (Signed)
 Copied from CRM [id number]. Topic: Clinical - Medication Refill >> Jul 03, 2024 10:19 AM Drema MATSU wrote: Medication: HYDROcodone -acetaminophen  (NORCO/VICODIN) 5-325 MG tablet  Has the patient contacted their pharmacy? Yes (Agent: If no, request that the patient contact the pharmacy for the refill. If patient does not wish to contact the pharmacy document the reason why and proceed with request.) contact dr (Agent: If yes, when and what did the pharmacy advise?)  This is the patient's preferred pharmacy:  [facility] - [place], [place] - [address] [place] [address] Phone: [phone] Fax: [phone]  Is this the correct pharmacy for this prescription? Yes If no, delete pharmacy and type the correct one.   Has the prescription been filled recently? Yes  Is the patient out of the medication? Yes one dose today   Has the patient been seen for an appointment in the last year OR does the patient have an upcoming appointment? Yes  Can we respond through MyChart? N/A  Agent: Please be advised that Rx refills may take up to 3 business days. We ask that you follow-up with your pharmacy.

## 2024-07-15 ENCOUNTER — Telehealth: Payer: Self-pay | Admitting: Family Medicine

## 2024-07-15 NOTE — Telephone Encounter (Signed)
 Copied from CRM [id number]. Topic: General - Other >> Jul 15, 2024  1:38 PM Robinson H wrote: Reason for CRM: Jonathon Martin is calling to notify office that she has faxed over a FL2 and order summary to have Dr. Ozell sign off on.  [facility] [phone]

## 2024-07-16 NOTE — Telephone Encounter (Signed)
 Forms were completed by PCP, faxed and sent to be scanned.

## 2024-07-29 ENCOUNTER — Other Ambulatory Visit: Payer: Self-pay | Admitting: Family Medicine

## 2024-07-29 DIAGNOSIS — M545 Low back pain, unspecified: Secondary | ICD-10-CM

## 2024-07-29 NOTE — Telephone Encounter (Unsigned)
 Copied from CRM #[id number]. Topic: Clinical - Medication Refill >> Jul 29, 2024  3:00 PM Rosina BIRCH wrote: Medication: HYDROcodone -acetaminophen   Has the patient contacted their pharmacy? No (Agent: If no, request that the patient contact the pharmacy for the refill. If patient does not wish to contact the pharmacy document the reason why and proceed with request.) (Agent: If yes, when and what did the pharmacy advise?)  This is the patient's preferred pharmacy:  [facility] - [place], [place] - [address] [place] [address] Phone: [phone] Fax: [phone]  Is this the correct pharmacy for this prescription? Yes If no, delete pharmacy and type the correct one.   Has the prescription been filled recently? Yes  Is the patient out of the medication? No  Has the patient been seen for an appointment in the last year OR does the patient have an upcoming appointment? {yes/no:[id number]}  Can we respond through MyChart? Yes  Agent: Please be advised that Rx refills may take up to 3 business days. We ask that you follow-up with your pharmacy.

## 2024-07-31 MED ORDER — HYDROCODONE-ACETAMINOPHEN 5-325 MG PO TABS: ORAL_TABLET | ORAL | 0 refills | Status: DC

## 2024-08-02 ENCOUNTER — Ambulatory Visit: Admitting: Family Medicine

## 2024-08-05 ENCOUNTER — Ambulatory Visit: Admitting: Family Medicine

## 2024-08-28 ENCOUNTER — Other Ambulatory Visit: Payer: Self-pay | Admitting: Family Medicine

## 2024-08-28 DIAGNOSIS — M545 Low back pain, unspecified: Secondary | ICD-10-CM

## 2024-08-28 MED ORDER — HYDROCODONE-ACETAMINOPHEN 5-325 MG PO TABS: ORAL_TABLET | ORAL | 0 refills | Status: DC

## 2024-09-30 ENCOUNTER — Other Ambulatory Visit: Payer: Self-pay | Admitting: Family Medicine

## 2024-09-30 DIAGNOSIS — M545 Low back pain, unspecified: Secondary | ICD-10-CM

## 2024-09-30 NOTE — Telephone Encounter (Unsigned)
 Copied from CRM #[id number]. Topic: Clinical - Medication Refill >> Sep 30, 2024  9:46 AM Robinson H wrote: Medication: HYDROcodone -acetaminophen  (NORCO/VICODIN) 5-325 MG tablet   Has the patient contacted their pharmacy? No, has to contact doctor first. Darrick Other [facility] calling (Agent: If no, request that the patient contact the pharmacy for the refill. If patient does not wish to contact the pharmacy document the reason why and proceed with request.) (Agent: If yes, when and what did the pharmacy advise?)  This is the patient's preferred pharmacy:  [facility] - [place], [place] - [address] [place] [address] Phone: [phone] Fax: [phone]  Is this the correct pharmacy for this prescription? Yes If no, delete pharmacy and type the correct one.   Has the prescription been filled recently? No  Is the patient out of the medication? Yes  Has the patient been seen for an appointment in the last year OR does the patient have an upcoming appointment? Yes  Can we respond through MyChart? No  Agent: Please be advised that Rx refills may take up to 3 business days. We ask that you follow-up with your pharmacy.

## 2024-10-01 NOTE — Telephone Encounter (Signed)
>   6 months since he was seen, please call the son in law and tell them that he MUST be seen for refills. 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 I cannot refill this until an appointment is made.

## 2024-10-01 NOTE — Telephone Encounter (Signed)
 No answer at the patient's cell number.  Left a detailed message at Mr Antrum- the son-in-law's voicemail stating the patient needs an appt as below.

## 2024-10-04 ENCOUNTER — Ambulatory Visit: Admitting: Family Medicine

## 2024-10-04 VITALS — BP 160/94 | HR 73 | Temp 98.0°F | Ht 69.0 in | Wt 140.8 lb

## 2024-10-04 DIAGNOSIS — Z23 Encounter for immunization: Secondary | ICD-10-CM | POA: Diagnosis not present

## 2024-10-04 DIAGNOSIS — I10 Essential (primary) hypertension: Secondary | ICD-10-CM | POA: Diagnosis not present

## 2024-10-04 DIAGNOSIS — K2990 Gastroduodenitis, unspecified, without bleeding: Secondary | ICD-10-CM | POA: Diagnosis not present

## 2024-10-04 DIAGNOSIS — G4700 Insomnia, unspecified: Secondary | ICD-10-CM

## 2024-10-04 DIAGNOSIS — E785 Hyperlipidemia, unspecified: Secondary | ICD-10-CM

## 2024-10-04 DIAGNOSIS — K2970 Gastritis, unspecified, without bleeding: Secondary | ICD-10-CM | POA: Diagnosis not present

## 2024-10-04 DIAGNOSIS — G8929 Other chronic pain: Secondary | ICD-10-CM

## 2024-10-04 DIAGNOSIS — F03B0 Unspecified dementia, moderate, without behavioral disturbance, psychotic disturbance, mood disturbance, and anxiety: Secondary | ICD-10-CM

## 2024-10-04 DIAGNOSIS — M545 Low back pain, unspecified: Secondary | ICD-10-CM | POA: Diagnosis not present

## 2024-10-04 MED ORDER — HYDROCODONE-ACETAMINOPHEN 5-325 MG PO TABS: ORAL_TABLET | ORAL | 0 refills | Status: AC

## 2024-10-04 MED ORDER — LISINOPRIL 40 MG PO TABS: 40.0000 mg | ORAL_TABLET | Freq: Every day | ORAL | 3 refills | Status: AC

## 2024-10-04 MED ORDER — ATORVASTATIN CALCIUM 20 MG PO TABS: 20.0000 mg | ORAL_TABLET | Freq: Every day | ORAL | 1 refills | Status: AC

## 2024-10-04 MED ORDER — ESOMEPRAZOLE MAGNESIUM 40 MG PO CPDR: 40.0000 mg | DELAYED_RELEASE_CAPSULE | Freq: Every day | ORAL | 3 refills | Status: AC

## 2024-10-04 MED ORDER — HYDROCODONE-ACETAMINOPHEN 5-325 MG PO TABS: ORAL_TABLET | ORAL | 0 refills | Status: DC

## 2024-10-04 MED ORDER — MIRTAZAPINE 15 MG PO TABS: ORAL_TABLET | ORAL | 1 refills | Status: AC

## 2024-10-04 MED ORDER — DONEPEZIL HCL 10 MG PO TABS: 10.0000 mg | ORAL_TABLET | Freq: Every day | ORAL | 3 refills | Status: AC

## 2024-10-04 NOTE — Progress Notes (Signed)
 Established Patient Office Visit  Subjective   Patient ID: Jonathon Martin, male    DOB: 1934-02-27  Age: 89 y.o. MRN: [id number]  Chief Complaint  Patient presents with   Medication Refill    States nothing to discuss.     Medication Refill   Discussed the use of AI scribe software for clinical note transcription with the patient, who gave verbal consent to proceed.  History of Present Illness   Jonathon Martin is an 89 year old male who presents for a follow-up visit regarding pain management and medication refills. He is accompanied by his daughter, who is also his primary caregiver.  He has been out of Norco for a few days, he reports that he doesn't feel like his pain is any worse, however His daughter reports increased phone calls since he ran out of medication. She reports that he is increasingly nervous and a little shaky.  He is currently taking lisinopril , Aricept , mirtazapine , and esomeprazole . Mirtazapine  is used to stimulate appetite and aid in sleep. His weight is stable at 140 pounds with no constipation.  He resides in an assisted living facility, enjoys watching TV, and uses an mining engineer wheelchair. He has many friends but prefers solitude. His daughter visits weekly.  His last lab work in April showed a creatinine level of 1.4, hemoglobin at 12.8, and an A1c of 5.8. No significant changes in mental status, confusion, or trouble at night. He maintains a good appetite.       Current Outpatient Medications  Medication Instructions   AMBULATORY NON FORMULARY MEDICATION Rollator   AMBULATORY NON FORMULARY MEDICATION Left AFO   aspirin EC 81 mg, Daily   atorvastatin  (LIPITOR) 20 mg, Oral, Daily   bisacodyl (DULCOLAX) 5 mg, Daily PRN   Calcium  Polycarbophil (FIBER-CAPS PO) Take by mouth.   donepezil  (ARICEPT ) 10 mg, Oral, Daily at bedtime   esomeprazole  (NEXIUM ) 40 mg, Oral, Daily   [START ON 11/28/2024] HYDROcodone -acetaminophen  (NORCO/VICODIN) 5-325 MG tablet TAKE  1 TABLET BY MOUTH AT 8AM AND 12PM (CONTROL);TAKE 2 TABLETS (10/650MG ) BY MOUTH AT 8PM   [START ON 10/31/2024] HYDROcodone -acetaminophen  (NORCO/VICODIN) 5-325 MG tablet TAKE 1 TABLET BY MOUTH AT 8AM AND 12PM (CONTROL);TAKE 2 TABLETS (10/650MG ) BY MOUTH AT 8PM   HYDROcodone -acetaminophen  (NORCO/VICODIN) 5-325 MG tablet TAKE 1 TABLET BY MOUTH AT 8AM AND 12PM (CONTROL);TAKE 2 TABLETS (10/650MG ) BY MOUTH AT 8PM   lisinopril  (ZESTRIL ) 40 mg, Oral, Daily   mirtazapine  (REMERON ) 15 MG tablet TAKE 1 TABLET BY MOUTH AT BEDTIME.   Multiple Vitamin (MULTIVITAMIN) capsule 1 capsule, Daily    Patient Active Problem List   Diagnosis Date Noted   Moderate dementia without behavioral disturbance, psychotic disturbance, mood disturbance, or anxiety (HCC) 08/10/2022   Insomnia 08/09/2022   Left foot drop 04/02/2021   Hyperlipidemia 09/08/2020   Herpes zoster without complication 12/16/2018   Nonexudative macular degeneration 10/13/2018   Posterior vitreous detachment of both eyes 10/13/2018   Pseudophakia of both eyes 10/13/2018   Chronic back pain 10/01/2018   Chronic leg pain 10/01/2018   Hypertension 10/01/2018     Review of Systems  All other systems reviewed and are negative.     Objective:     BP (!) 160/94   Pulse 73   Temp 98 F (36.7 C) (Oral)   Ht 5' 9 (1.753 m)   Wt 140 lb 12.8 oz (63.9 kg)   SpO2 97%   BMI 20.79 kg/m    Physical Exam Vitals reviewed.  Constitutional:  Appearance: Normal appearance. He is well-groomed and underweight.     Comments: In wheelchair  Eyes:     Extraocular Movements: Extraocular movements intact.     Conjunctiva/sclera: Conjunctivae normal.  Neck:     Thyroid : No thyromegaly.  Cardiovascular:     Rate and Rhythm: Normal rate and regular rhythm.     Heart sounds: S1 normal and S2 normal. No murmur heard. Pulmonary:     Effort: Pulmonary effort is normal.     Breath sounds: Normal breath sounds and air entry. No rales.  Abdominal:      General: Abdomen is flat. Bowel sounds are normal.  Musculoskeletal:     Right lower leg: No edema.     Left lower leg: No edema.  Neurological:     General: No focal deficit present.     Mental Status: He is alert and oriented to person, place, and time.     Gait: Gait is intact.  Psychiatric:        Mood and Affect: Mood and affect normal.      No results found for any visits on 10/04/24.    The ASCVD Risk score (Arnett DK, et al., 2019) failed to calculate for the following reasons:   The 2019 ASCVD risk score is only valid for ages 77 to 31    Assessment & Plan:  Primary hypertension -     Lisinopril ; Take 1 tablet (40 mg total) by mouth daily.  Dispense: 90 tablet; Refill: 3  Chronic midline low back pain without sciatica -     HYDROcodone -Acetaminophen ; TAKE 1 TABLET BY MOUTH AT 8AM AND 12PM (CONTROL);TAKE 2 TABLETS (10/650MG ) BY MOUTH AT 8PM  Dispense: 120 tablet; Refill: 0 -     HYDROcodone -Acetaminophen ; TAKE 1 TABLET BY MOUTH AT 8AM AND 12PM (CONTROL);TAKE 2 TABLETS (10/650MG ) BY MOUTH AT 8PM  Dispense: 120 tablet; Refill: 0 -     HYDROcodone -Acetaminophen ; TAKE 1 TABLET BY MOUTH AT 8AM AND 12PM (CONTROL);TAKE 2 TABLETS (10/650MG ) BY MOUTH AT 8PM  Dispense: 120 tablet; Refill: 0  Hyperlipidemia, unspecified hyperlipidemia type -     Atorvastatin  Calcium ; Take 1 tablet (20 mg total) by mouth daily.  Dispense: 90 tablet; Refill: 1  Moderate dementia without behavioral disturbance, psychotic disturbance, mood disturbance, or anxiety, unspecified dementia type (HCC) -     Donepezil  HCl; Take 1 tablet (10 mg total) by mouth at bedtime.  Dispense: 90 tablet; Refill: 3  Insomnia, unspecified type -     Mirtazapine ; TAKE 1 TABLET BY MOUTH AT BEDTIME.  Dispense: 90 tablet; Refill: 1  Gastritis and gastroduodenitis -     Esomeprazole  Magnesium ; Take 1 capsule (40 mg total) by mouth daily at 12 noon.  Dispense: 90 capsule; Refill: 3  Immunization due -     Flu vaccine HIGH  DOSE PF(Fluzone Trivalent)   Assessment and Plan    Chronic low back pain Chronic low back pain exacerbated by running out of pain medication, leading to withdrawal symptoms such as shakiness and nervousness. - Restart Norco for pain management. - Schedule follow-up visits every four months to comply with [facility] regulations for controlled substances.  Hypertension Elevated blood pressure likely due to being out of pain medication. - Monitor blood pressure at assisted living facility to ensure lisinopril  efficacy.  Chronic kidney disease, unspecified stage Chronic kidney disease with stable kidney function, indicated by creatinine level of 1.4.  Cognitive impairment Cognitive function appears stable with no significant changes reported.  Appetite disturbance Appetite disturbance managed  with mirtazapine , also used for sleep. Weight is stable at 140 pounds. - Continue mirtazapine  for appetite stimulation and sleep.  Gastroesophageal reflux disease GERD managed with esomeprazole . No recent refill, but no issues reported. - Refill esomeprazole  prescription.        Return in about 3 months (around 01/04/2025).    Heron CHRISTELLA Sharper, MD

## 2024-12-06 ENCOUNTER — Other Ambulatory Visit: Payer: Self-pay | Admitting: Family Medicine

## 2024-12-30 ENCOUNTER — Telehealth: Payer: Self-pay

## 2024-12-30 NOTE — Telephone Encounter (Signed)
 Copied from CRM [id number]. Topic: Clinical - Medication Refill >> Dec 30, 2024 11:05 AM Rea C wrote: Medication: HYDROcodone -acetaminophen  (NORCO/VICODIN) 5-325 MG tablet  Has the patient contacted their pharmacy? No (Agent: If no, request that the patient contact the pharmacy for the refill. If patient does not wish to contact the pharmacy document the reason why and proceed with request.) (Agent: If yes, when and what did the pharmacy advise?)  This is the patient's preferred pharmacy:  [facility] - [place], [place] - [address] [place] [address] Phone: [phone] Fax: [phone]  Is this the correct pharmacy for this prescription? Yes If no, delete pharmacy and type the correct one.   Has the prescription been filled recently? Yes  Is the patient out of the medication? 6 left   Has the patient been seen for an appointment in the last year OR does the patient have an upcoming appointment? Yes  Can we respond through MyChart? Yes  Agent: Please be advised that Rx refills may take up to 3 business days. We ask that you follow-up with your pharmacy.

## 2024-12-31 ENCOUNTER — Other Ambulatory Visit: Payer: Self-pay | Admitting: Family Medicine

## 2024-12-31 DIAGNOSIS — G8929 Other chronic pain: Secondary | ICD-10-CM

## 2024-12-31 MED ORDER — HYDROCODONE-ACETAMINOPHEN 5-325 MG PO TABS: ORAL_TABLET | ORAL | 0 refills | Status: AC
